# Patient Record
Sex: Female | Born: 1995 | Race: Black or African American | Hispanic: No | Marital: Single | Smoking: Current some day smoker
Health system: Southern US, Community
[De-identification: ages and names within clinical notes are randomized; demographics above are authoritative.]

## PROBLEM LIST (undated history)

## (undated) ENCOUNTER — Inpatient Hospital Stay: Payer: Self-pay

## (undated) DIAGNOSIS — D649 Anemia, unspecified: Secondary | ICD-10-CM

## (undated) HISTORY — PX: TONSILLECTOMY: SUR1361

## (undated) HISTORY — PX: NASAL SINUS SURGERY: SHX719

---
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
## 2013-06-20 ENCOUNTER — Inpatient Hospital Stay: Payer: Self-pay | Admitting: Internal Medicine

## 2013-06-20 LAB — COMPREHENSIVE METABOLIC PANEL
Albumin: 3.5 g/dL — ABNORMAL LOW (ref 3.8–5.6)
Alkaline Phosphatase: 123 U/L (ref 82–169)
Anion Gap: 8 (ref 7–16)
BUN: 14 mg/dL (ref 7–18)
Bilirubin,Total: 1.4 mg/dL — ABNORMAL HIGH (ref 0.2–1.0)
Chloride: 102 mmol/L (ref 98–107)
Co2: 24 mmol/L (ref 21–32)
Creatinine: 1.04 mg/dL (ref 0.60–1.30)
EGFR (Non-African Amer.): >60
Glucose: 123 mg/dL — ABNORMAL HIGH (ref 65–99)
Osmolality: 270 (ref 275–301)
Potassium: 3.7 mmol/L (ref 3.5–5.1)
SGOT(AST): 38 U/L — ABNORMAL HIGH (ref 0–26)
SGPT (ALT): 37 U/L (ref 12–78)
Sodium: 134 mmol/L — ABNORMAL LOW (ref 136–145)
Total Protein: 8.4 g/dL (ref 6.4–8.6)

## 2013-06-20 LAB — CBC
HCT: 34.7 % — ABNORMAL LOW (ref 35.0–47.0)
HGB: 11.2 g/dL — ABNORMAL LOW (ref 12.0–16.0)
MCH: 25.8 pg — ABNORMAL LOW (ref 26.0–34.0)
MCHC: 32.4 g/dL (ref 32.0–36.0)
MCV: 80 fL (ref 80–100)
Platelet: 225 10*3/uL (ref 150–440)
RBC: 4.36 10*6/uL (ref 3.80–5.20)
RDW: 14.9 % — ABNORMAL HIGH (ref 11.5–14.5)
WBC: 17.8 10*3/uL — ABNORMAL HIGH (ref 3.6–11.0)

## 2013-06-20 LAB — URINALYSIS, COMPLETE
Bilirubin,UR: NEGATIVE
Nitrite: POSITIVE
Protein: 100
RBC,UR: 14 /HPF (ref 0–5)
Renal Epithelial: 1
Specific Gravity: 1.021 (ref 1.003–1.030)

## 2013-06-20 LAB — LIPASE, BLOOD: Lipase: 39 U/L — ABNORMAL LOW (ref 73–393)

## 2013-06-21 LAB — CBC WITH DIFFERENTIAL/PLATELET
Basophil #: 0 10*3/uL (ref 0.0–0.1)
Basophil %: 0.3 %
Eosinophil #: 0.1 10*3/uL (ref 0.0–0.7)
Eosinophil %: 0.4 %
Lymphocyte #: 2.8 10*3/uL (ref 1.0–3.6)
Lymphocyte %: 18 %
MCH: 26.1 pg (ref 26.0–34.0)
MCV: 79 fL — ABNORMAL LOW (ref 80–100)
Monocyte #: 1.4 x10 3/mm — ABNORMAL HIGH (ref 0.2–0.9)
Monocyte %: 8.8 %
Neutrophil #: 11.4 10*3/uL — ABNORMAL HIGH (ref 1.4–6.5)
Neutrophil %: 72.5 %
Platelet: 183 10*3/uL (ref 150–440)
RBC: 3.65 10*6/uL — ABNORMAL LOW (ref 3.80–5.20)
RDW: 15 % — ABNORMAL HIGH (ref 11.5–14.5)
WBC: 15.8 10*3/uL — ABNORMAL HIGH (ref 3.6–11.0)

## 2013-06-21 LAB — BASIC METABOLIC PANEL
Anion Gap: 7 (ref 7–16)
Calcium, Total: 8 mg/dL — ABNORMAL LOW (ref 9.0–10.7)
Chloride: 108 mmol/L — ABNORMAL HIGH (ref 98–107)
EGFR (African American): >60
Sodium: 139 mmol/L (ref 136–145)

## 2013-06-22 LAB — URINE CULTURE

## 2013-06-25 LAB — CULTURE, BLOOD (SINGLE)

## 2013-11-12 ENCOUNTER — Emergency Department: Payer: Self-pay | Admitting: Internal Medicine

## 2013-11-12 LAB — COMPREHENSIVE METABOLIC PANEL
ALBUMIN: 3.8 g/dL (ref 3.4–5.0)
Alkaline Phosphatase: 70 U/L
Anion Gap: 6 — ABNORMAL LOW (ref 7–16)
BUN: 12 mg/dL (ref 7–18)
Bilirubin,Total: 0.3 mg/dL (ref 0.2–1.0)
CALCIUM: 8.7 mg/dL (ref 8.5–10.1)
CHLORIDE: 104 mmol/L (ref 98–107)
CO2: 25 mmol/L (ref 21–32)
Creatinine: 0.55 mg/dL — ABNORMAL LOW (ref 0.60–1.30)
Glucose: 86 mg/dL (ref 65–99)
Osmolality: 269 (ref 275–301)
Potassium: 3.3 mmol/L — ABNORMAL LOW (ref 3.5–5.1)
SGOT(AST): 21 U/L (ref 15–37)
SGPT (ALT): 18 U/L (ref 12–78)
Sodium: 135 mmol/L — ABNORMAL LOW (ref 136–145)
TOTAL PROTEIN: 8.1 g/dL (ref 6.4–8.2)

## 2013-11-12 LAB — HCG, QUANTITATIVE, PREGNANCY: BETA HCG, QUANT.: 48223 m[IU]/mL — AB

## 2013-11-12 LAB — CBC
HCT: 31.2 % — ABNORMAL LOW (ref 35.0–47.0)
HGB: 10 g/dL — AB (ref 12.0–16.0)
MCH: 25 pg — ABNORMAL LOW (ref 26.0–34.0)
MCHC: 32.2 g/dL (ref 32.0–36.0)
MCV: 78 fL — AB (ref 80–100)
PLATELETS: 298 10*3/uL (ref 150–440)
RBC: 4.01 10*6/uL (ref 3.80–5.20)
RDW: 14.5 % (ref 11.5–14.5)
WBC: 6.5 10*3/uL (ref 3.6–11.0)

## 2013-11-12 LAB — URINALYSIS, COMPLETE
BILIRUBIN, UR: NEGATIVE
Blood: NEGATIVE
Glucose,UR: NEGATIVE mg/dL (ref 0–75)
Ketone: NEGATIVE
Nitrite: NEGATIVE
PROTEIN: NEGATIVE
Ph: 6 (ref 4.5–8.0)
RBC,UR: 2 /HPF (ref 0–5)
Specific Gravity: 1.024 (ref 1.003–1.030)
WBC UR: 6 /HPF (ref 0–5)

## 2013-11-12 LAB — GC/CHLAMYDIA PROBE AMP

## 2013-11-12 LAB — WET PREP, GENITAL

## 2013-11-14 LAB — URINE CULTURE

## 2014-04-19 ENCOUNTER — Ambulatory Visit: Payer: Self-pay | Admitting: Oncology

## 2014-04-19 LAB — CBC CANCER CENTER
BASOS PCT: 0.1 %
Basophil #: 0 x10 3/mm (ref 0.0–0.1)
EOS ABS: 0.1 x10 3/mm (ref 0.0–0.7)
EOS PCT: 0.8 %
HCT: 27.3 % — AB (ref 35.0–47.0)
HGB: 8.6 g/dL — ABNORMAL LOW (ref 12.0–16.0)
LYMPHS ABS: 2 x10 3/mm (ref 1.0–3.6)
Lymphocyte %: 25.6 %
MCH: 25.2 pg — ABNORMAL LOW (ref 26.0–34.0)
MCHC: 31.5 g/dL — ABNORMAL LOW (ref 32.0–36.0)
MCV: 80 fL (ref 80–100)
MONO ABS: 0.6 x10 3/mm (ref 0.2–0.9)
Monocyte %: 7.7 %
NEUTROS ABS: 5 x10 3/mm (ref 1.4–6.5)
Neutrophil %: 65.8 %
Platelet: 324 x10 3/mm (ref 150–440)
RBC: 3.41 10*6/uL — ABNORMAL LOW (ref 3.80–5.20)
RDW: 15.1 % — ABNORMAL HIGH (ref 11.5–14.5)
WBC: 7.6 x10 3/mm (ref 3.6–11.0)

## 2014-04-19 LAB — IRON AND TIBC
IRON BIND. CAP.(TOTAL): 540 ug/dL — AB (ref 250–450)
IRON SATURATION: 33 %
Iron: 178 ug/dL — ABNORMAL HIGH (ref 50–170)
UNBOUND IRON-BIND. CAP.: 362 ug/dL

## 2014-04-19 LAB — RETICULOCYTES
Absolute Retic Count: 0.0899 10*6/uL (ref 0.019–0.186)
Reticulocyte: 2.64 % (ref 0.4–3.1)

## 2014-04-19 LAB — FOLATE: FOLIC ACID: 17.5 ng/mL (ref 3.1–100.0)

## 2014-04-19 LAB — LACTATE DEHYDROGENASE: LDH: 204 U/L (ref 81–246)

## 2014-04-19 LAB — FERRITIN: FERRITIN (ARMC): 7 ng/mL — AB (ref 8–388)

## 2014-04-20 ENCOUNTER — Ambulatory Visit: Payer: Self-pay | Admitting: Oncology

## 2014-05-20 ENCOUNTER — Ambulatory Visit: Payer: Self-pay | Admitting: Oncology

## 2014-06-21 ENCOUNTER — Inpatient Hospital Stay: Payer: Self-pay

## 2014-06-21 LAB — PIH PROFILE
ANION GAP: 9 (ref 7–16)
BUN: 6 mg/dL — AB (ref 7–18)
CHLORIDE: 107 mmol/L (ref 98–107)
CREATININE: 0.52 mg/dL — AB (ref 0.60–1.30)
Calcium, Total: 8.7 mg/dL (ref 8.5–10.1)
Co2: 26 mmol/L (ref 21–32)
EGFR (Non-African Amer.): >60
GLUCOSE: 82 mg/dL (ref 65–99)
HCT: 32.3 % — ABNORMAL LOW (ref 35.0–47.0)
HGB: 10.4 g/dL — ABNORMAL LOW (ref 12.0–16.0)
MCH: 26.9 pg (ref 26.0–34.0)
MCHC: 32.2 g/dL (ref 32.0–36.0)
MCV: 84 fL (ref 80–100)
Osmolality: 280 (ref 275–301)
Platelet: 259 10*3/uL (ref 150–440)
Potassium: 3.4 mmol/L — ABNORMAL LOW (ref 3.5–5.1)
RBC: 3.87 10*6/uL (ref 3.80–5.20)
RDW: 20.7 % — AB (ref 11.5–14.5)
SGOT(AST): 24 U/L (ref 15–37)
SODIUM: 142 mmol/L (ref 136–145)
URIC ACID: 2.9 mg/dL (ref 2.6–6.0)
WBC: 9.4 10*3/uL (ref 3.6–11.0)

## 2014-06-21 LAB — PROTEIN / CREATININE RATIO, URINE
Creatinine, Urine: 59.5 mg/dL (ref 30.0–125.0)
PROTEIN/CREAT. RATIO: 1227 mg/g{creat} — AB (ref 0–200)
Protein, Random Urine: 73 mg/dL — ABNORMAL HIGH (ref 0–12)

## 2014-06-23 LAB — CBC WITH DIFFERENTIAL/PLATELET
BASOS ABS: 0 10*3/uL (ref 0.0–0.1)
BASOS PCT: 0.1 %
EOS ABS: 0.1 10*3/uL (ref 0.0–0.7)
Eosinophil %: 0.5 %
HCT: 34.3 % — ABNORMAL LOW (ref 35.0–47.0)
HGB: 11 g/dL — AB (ref 12.0–16.0)
Lymphocyte #: 1.9 10*3/uL (ref 1.0–3.6)
Lymphocyte %: 19 %
MCH: 27 pg (ref 26.0–34.0)
MCHC: 32.1 g/dL (ref 32.0–36.0)
MCV: 84 fL (ref 80–100)
MONOS PCT: 10.8 %
Monocyte #: 1.1 x10 3/mm — ABNORMAL HIGH (ref 0.2–0.9)
NEUTROS ABS: 7.1 10*3/uL — AB (ref 1.4–6.5)
Neutrophil %: 69.6 %
Platelet: 288 10*3/uL (ref 150–440)
RBC: 4.08 10*6/uL (ref 3.80–5.20)
RDW: 20.5 % — ABNORMAL HIGH (ref 11.5–14.5)
WBC: 10.2 10*3/uL (ref 3.6–11.0)

## 2014-06-23 LAB — COMPREHENSIVE METABOLIC PANEL
ALBUMIN: 2.6 g/dL — AB (ref 3.4–5.0)
ALK PHOS: 275 U/L — AB
ANION GAP: 10 (ref 7–16)
BUN: 4 mg/dL — ABNORMAL LOW (ref 7–18)
Bilirubin,Total: 0.3 mg/dL (ref 0.2–1.0)
CO2: 23 mmol/L (ref 21–32)
Calcium, Total: 8.6 mg/dL (ref 8.5–10.1)
Chloride: 108 mmol/L — ABNORMAL HIGH (ref 98–107)
Creatinine: 0.53 mg/dL — ABNORMAL LOW (ref 0.60–1.30)
EGFR (African American): >60
EGFR (Non-African Amer.): >60
Glucose: 91 mg/dL (ref 65–99)
Osmolality: 278 (ref 275–301)
Potassium: 3.3 mmol/L — ABNORMAL LOW (ref 3.5–5.1)
SGOT(AST): 27 U/L (ref 15–37)
SGPT (ALT): 34 U/L
SODIUM: 141 mmol/L (ref 136–145)
Total Protein: 7.2 g/dL (ref 6.4–8.2)

## 2014-06-24 ENCOUNTER — Ambulatory Visit: Payer: Self-pay | Admitting: Oncology

## 2014-06-24 LAB — GC/CHLAMYDIA PROBE AMP

## 2014-06-24 LAB — CBC WITH DIFFERENTIAL/PLATELET
BASOS PCT: 0.2 %
Basophil #: 0 10*3/uL (ref 0.0–0.1)
EOS PCT: 0.6 %
Eosinophil #: 0.1 10*3/uL (ref 0.0–0.7)
HCT: 28.3 % — ABNORMAL LOW (ref 35.0–47.0)
HGB: 8.8 g/dL — ABNORMAL LOW (ref 12.0–16.0)
LYMPHS PCT: 19 %
Lymphocyte #: 2.5 10*3/uL (ref 1.0–3.6)
MCH: 26.2 pg (ref 26.0–34.0)
MCHC: 31.2 g/dL — AB (ref 32.0–36.0)
MCV: 84 fL (ref 80–100)
MONO ABS: 1.1 x10 3/mm — AB (ref 0.2–0.9)
Monocyte %: 8.4 %
Neutrophil #: 9.3 10*3/uL — ABNORMAL HIGH (ref 1.4–6.5)
Neutrophil %: 71.8 %
Platelet: 230 10*3/uL (ref 150–440)
RBC: 3.36 10*6/uL — ABNORMAL LOW (ref 3.80–5.20)
RDW: 20.9 % — AB (ref 11.5–14.5)
WBC: 12.9 10*3/uL — AB (ref 3.6–11.0)

## 2014-06-27 ENCOUNTER — Ambulatory Visit: Payer: Self-pay | Admitting: Oncology

## 2014-08-17 ENCOUNTER — Emergency Department: Payer: Self-pay | Admitting: Emergency Medicine

## 2014-08-17 LAB — CBC WITH DIFFERENTIAL/PLATELET
BASOS ABS: 0 10*3/uL (ref 0.0–0.1)
Basophil %: 0.2 %
EOS ABS: 0 10*3/uL (ref 0.0–0.7)
EOS PCT: 0 %
HCT: 35.6 % (ref 35.0–47.0)
HGB: 11.3 g/dL — ABNORMAL LOW (ref 12.0–16.0)
LYMPHS PCT: 11.9 %
Lymphocyte #: 2.2 10*3/uL (ref 1.0–3.6)
MCH: 26.3 pg (ref 26.0–34.0)
MCHC: 31.7 g/dL — ABNORMAL LOW (ref 32.0–36.0)
MCV: 83 fL (ref 80–100)
Monocyte #: 1.6 x10 3/mm — ABNORMAL HIGH (ref 0.2–0.9)
Monocyte %: 8.7 %
NEUTROS PCT: 79.2 %
Neutrophil #: 14.7 10*3/uL — ABNORMAL HIGH (ref 1.4–6.5)
PLATELETS: 271 10*3/uL (ref 150–440)
RBC: 4.28 10*6/uL (ref 3.80–5.20)
RDW: 15.2 % — AB (ref 11.5–14.5)
WBC: 18.6 10*3/uL — ABNORMAL HIGH (ref 3.6–11.0)

## 2014-08-17 LAB — URINALYSIS, COMPLETE
Bilirubin,UR: NEGATIVE
Glucose,UR: NEGATIVE mg/dL (ref 0–75)
KETONE: NEGATIVE
NITRITE: POSITIVE
Ph: 6 (ref 4.5–8.0)
Protein: 100
Specific Gravity: 1.012 (ref 1.003–1.030)
Squamous Epithelial: 18
WBC UR: 403 /HPF (ref 0–5)

## 2014-08-17 LAB — COMPREHENSIVE METABOLIC PANEL
ANION GAP: 9 (ref 7–16)
AST: 22 U/L (ref 15–37)
Albumin: 3.2 g/dL — ABNORMAL LOW (ref 3.4–5.0)
Alkaline Phosphatase: 121 U/L — ABNORMAL HIGH
BUN: 9 mg/dL (ref 7–18)
Bilirubin,Total: 1.3 mg/dL — ABNORMAL HIGH (ref 0.2–1.0)
CALCIUM: 8.6 mg/dL (ref 8.5–10.1)
CO2: 23 mmol/L (ref 21–32)
CREATININE: 1.06 mg/dL (ref 0.60–1.30)
Chloride: 106 mmol/L (ref 98–107)
EGFR (African American): >60
EGFR (Non-African Amer.): >60
Glucose: 114 mg/dL — ABNORMAL HIGH (ref 65–99)
Osmolality: 275 (ref 275–301)
POTASSIUM: 3.1 mmol/L — AB (ref 3.5–5.1)
SGPT (ALT): 27 U/L
Sodium: 138 mmol/L (ref 136–145)
Total Protein: 7.5 g/dL (ref 6.4–8.2)

## 2014-08-19 LAB — URINE CULTURE

## 2014-08-22 LAB — CULTURE, BLOOD (SINGLE)

## 2014-12-10 NOTE — Discharge Summary (Signed)
PATIENT NAME:  Joy Berry, Joy Berry MR#:  [id number] DATE OF BIRTH:  1996/04/19  DATE OF ADMISSION:  06/20/2013 DATE OF DISCHARGE:  06/22/2013  PRESENTING COMPLAINT: Fever, abdominal pain and vomiting.   DISCHARGE DIAGNOSES: 1. Acute bilateral pyelonephritis, left more than the right.  2. Escherichia coli urinary tract infection.  3/ Nausea and vomiting, resolved.   CODE STATUS: FULL CODE.   MEDICATIONS: 1. Tylenol orally as needed.  2. Cefuroxime 500 mg Berry.o. b.i.d. for six more days to complete a 10 day course.   FOLLOWUP: With Dr. Lacie ScottsNiemeyer in 1 to 2 weeks.   LABORATORY DATA:  1. White count is 15.8, H and H is 9.6 and 28.8. Glucose is 121, BUN 9, creatinine 0.85, sodium is 139, potassium is 3.3, chloride is 108, bicarbonate is 24.  2. Blood cultures negative in 48 hours.  3. Urine culture positive for Escherichia coli which is pan sensitive, just resistant to ampicillin.  4. CT of the abdomen and pelvis with contrast shows findings concerning for pyelonephritis, worse on the left than the right.   BRIEF SUMMARY OF HOSPITAL COURSE: Joy Berry is a 19 year old African American female with no past medical history, comes into the Emergency Room with nausea, vomiting along with left flank pain. She is being admitted with:  1. Systemic inflammatory response syndrome secondary to bilateral pyelonephritis. Bilateral hydronephrosis worse on the left than the right. She was started on IV fluids, broad-spectrum antibiotics with IV Rocephin b.i.d. Blood cultures are negative. Urine cultures positive for Escherichia coli. Her medications have been changed to Berry.o. cefuroxime. She will complete a 10 day course. Follow up with Dr. Lacie ScottsNiemeyer as outpatient if she remains afebrile. The patient is recommended to take Tylenol for low-grade fever if she has.  2. Leukocytosis, appears to be due to bilateral pyelonephritis improving.  3. Mild hyponatremia resolved with IV fluids.  4. Deep vein thrombosis  prophylaxis. The patient was ambulatory and subcutaneous Lovenox was given.   Hospital stay otherwise remained stable. Discharge plan was discussed with the patient's mother over on the phone.   TIME SPENT: 40 minutes. ____________________________ Wylie HailSona A. Allena KatzPatel, MD sap:sg D: 06/22/2013 13:55:45 ET T: 06/22/2013 14:42:25 ET JOB#: [id number]  cc: Joy Berry A. Allena KatzPatel, MD, <Dictator> Meindert A. Lacie ScottsNiemeyer, MD  Willow OraSONA A Haydan Mansouri MD ELECTRONICALLY SIGNED 06/25/2013 13:31

## 2014-12-10 NOTE — H&P (Signed)
PATIENT NAME:  Joy Berry, Joy Berry MR#:  [id number] DATE OF BIRTH:  October 24, 1995  DATE OF ADMISSION:  06/20/2013  PRESENTING COMPLAINTS: Vomiting and left back pain.    HISTORY OF PRESENT ILLNESS:  Joy Berry is a 19 year old African American female with no significant past medical history, who comes to the Emergency Room with complaints of nausea, vomiting and left flank and left upper quadrant pain for the last 2 days. In the Emergency Room, the patient was found to have fever of 100.9. She is tachycardic with heart rate in the 110s to 120s. She was found to have elevated white count of 17,000 and urinalysis was severely positive for urinary tract infection.  Given her symptoms, CT of the abdomen was done which shows bilateral pyelonephritis and the patient is being admitted with SIRS secondary to bilateral pyelonephritis worse on the left than the right. She received one dose of IV Rocephin in the Emergency Room. Blood cultures and urine cultures were sent.   PAST MEDICAL HISTORY: None.   ALLERGIES: No known drug allergies.   PAST SURGICAL HISTORY: Tonsillectomy and some sinus surgery as a child.   MEDICATIONS:  Tylenol as needed.   SOCIAL HISTORY: The patient is currently unemployed. Smokes a "cigar" on a daily basis. Denies any other street drug use or any alcohol use.   FAMILY HISTORY: Positive for hypertension in mother.   REVIEW OF SYSTEMS:  CONSTITUTIONAL: No fever. Positive for fatigue, weakness.  EYES: No blurred or double vision.  ENT: No tinnitus, ear pain, hearing loss.  RESPIRATORY: No cough, hemoptysis or shortness of breath.  CARDIOVASCULAR: No chest pain, orthopnea, edema or hypertension.  GASTROINTESTINAL: Positive for vomiting and abdominal pain. No melena or diarrhea.  GENITOURINARY: Positive for dysuria, frequency and foul-smelling urine.  ENDOCRINE: No polyuria, nocturia or thyroid problems.  HEMATOLOGY: No anemia or easy bruising or any bleeding disorder.   MUSCULOSKELETAL: No arthritis, swelling or gout.  NEUROLOGIC: No CVA, TIA, tremors or vertigo.  PSYCHIATRIC:  No anxiety, depression or bipolar disorder.  All other systems reviewed are negative.   PHYSICAL EXAMINATION: GENERAL: The patient is awake, alert, oriented x 3, not in acute distress.  VITAL SIGNS: Temperature is 100.7, pulse is 128, blood pressure 118/74, pulse oximetry is 99% on room air.  HEENT: Atraumatic, normocephalic. Pupils: PERRLA.  EOM intact. Oral mucosa is dry.  NECK: Supple. No JVD. No carotid bruit.  RESPIRATORY: Clear to auscultation bilaterally. No rales, rhonchi, respiratory distress or labored breathing.  CARDIOVASCULAR: Tachycardia present. Both heart sounds are normal. No murmur heard. PMI not lateralized. Chest is nontender.  ABDOMEN: Soft. There is some tenderness present in the left upper quadrant along with left flank and left costovertebral angle. No guarding, rigidity or any mass felt. There is some tenderness in the right costovertebral angle as well. Bowel sounds are normal.  SKIN: Warm and dry.  NEUROLOGIC:  Grossly intact cranial nerves II through XII.  No motor or sensory deficit. No tremors.  PSYCHIATRIC: The patient is awake, alert, oriented x 3.   LABORATORY AND IMAGING DATA:  CT of the abdomen shows findings concerning for pyelonephritis worse on the left than the right.   Lactic acid 1.3. White count is 17.8, hemoglobin and hematocrit is 11.2 and 34.7, platelet count is 225. Glucose is 123, BUN 14 creatinine is 1.04, sodium 134. The rest of the chemistries are normal. Bilirubin is 1.4 and SGOT is 38, lipase is 39. Urinalysis positive for urinary tract infection.   ASSESSMENT: A 19 year old,  Joy Berry with no significant past medical history, comes in with fever, nausea and vomiting along with left flank pain is being admitted with:  1.  Systemic inflammatory response syndrome secondary to bilateral palmar nephritis, worse on the left than the  right. The patient is going to be admitted on the medical floor. Continue on IV fluids. Continue broad-spectrum antibiotics with IV Rocephin b.i.d. for now. Follow blood cultures, urine cultures, and white count. We will give Tylenol around the clock and continue aggressive intravenous fluids for hydration.  2.  Leukocytosis, appears to be due to bilateral pyelonephritis.  3.  Mild hyponatremia is suspected due to mild dehydration. Will give aggressive IV hydration.  4.  Deep vein thrombosis prophylaxis with subcutaneous Lovenox.  5.  Diet. We will start patient on clear liquid diet and advance to regular food as tolerated.  6.  Above was discussed with the patient and the patient's family member.  Further work-up according to the patient's clinical course.   TIME SPENT: 55 minutes    ____________________________ Jearl KlinefelterSona A. Allena KatzPatel, MD sap:dp D: 06/20/2013 09:16:38 ET T: 06/20/2013 09:41:40 ET JOB#: [id number]  cc: Chenae Brager A. Allena KatzPatel, MD, <Dictator> Willow OraSONA A Brianne Maina MD ELECTRONICALLY SIGNED 06/25/2013 13:29

## 2014-12-28 NOTE — H&P (Signed)
L&D Evaluation:  History:  HPI 18yo G1P0 at 38+5 with EDC of 07/01/14 presenting for evaluation after elevated BP in the office, with 2+protein on urine dip. No prior hx of BP >140, though have been in 130s without protein on dip. Denies sx of severe PreE - no visual changes, no headache, no RUQ pain. +LE swelling for last month. Good FM, no LOF, occassional contractions.   Initial BP in triage 157/94. Serial pressures on admission with highest pressure at 21:53 of 165/105 with immediate repeat of 142/83.  PIH labs wnl except for protein/creatinine ratio of 1226. No clonus, 1+ reflexes.  FHT- Reactive NST   Presents with elevated BP   Patient's Medical History No Chronic Illness   Patient's Surgical History none   Medications Pre Natal Vitamins  Iron   Allergies NKDA   Social History none   Family History Non-Contributory   ROS:  ROS All systems were reviewed.  HEENT, CNS, GI, GU, Respiratory, CV, Renal and Musculoskeletal systems were found to be normal.   Exam:  Vital Signs BP >140/90   Urine Protein 1+   General no apparent distress   Mental Status clear   Chest clear  CTAB   Heart normal sinus rhythm, no murmur/gallop/rubs   Abdomen gravid, non-tender   Estimated Fetal Weight Average for gestational age   Fetal Position vtx by Leopolds   Back no CVAT   Edema 2+  Pitting   Reflexes 2+   Clonus negative   Pelvic no external lesions, 1/50/high, posterior, moderately soft   Mebranes Intact   FHT normal rate with no decels   Ucx irregular   Impression:  Impression PreEclampsia   Plan:  Plan antibiotics for GBBS prophylaxis   Comments G1P0 at 38+5wks with preE without severe features based on elevated BP and new onset proteinuria. Fetal testing reassuring. - IOL per guidelines at term. Will not start mag at this time as no severe features per recent ACOG guidelines, though administration is reasonable in active labor. Will continuously  reassess. Bed availability limited at this time, will plan for cervical ripening starting in am. IOL expectations discussed with patient and family. PreE progression and treatment discussed with family. - Serial BP - GBS ppx not indicated - Rh positive - Pt interested in epidural when in labor   Electronic Signatures: Cline CoolsBeasley, Kahlil Cowans E (MD)  (Signed [id number]-Nov-15 04:23)  Authored: L&D Evaluation   Last Updated: 03-Nov-15 04:23 by Cline CoolsBeasley, Yen Wandell E (MD)

## 2015-01-27 ENCOUNTER — Encounter: Payer: Self-pay | Admitting: Emergency Medicine

## 2015-01-27 ENCOUNTER — Emergency Department
Admission: EM | Admit: 2015-01-27 | Discharge: 2015-01-27 | Disposition: A | Discharge status: Home / Self Care | Payer: Medicaid Other | Attending: Emergency Medicine | Admitting: Emergency Medicine

## 2015-01-27 DIAGNOSIS — R6883 Chills (without fever): Secondary | ICD-10-CM | POA: Diagnosis present

## 2015-01-27 DIAGNOSIS — J3489 Other specified disorders of nose and nasal sinuses: Secondary | ICD-10-CM | POA: Insufficient documentation

## 2015-01-27 DIAGNOSIS — R040 Epistaxis: Secondary | ICD-10-CM | POA: Insufficient documentation

## 2015-01-27 DIAGNOSIS — J0120 Acute ethmoidal sinusitis, unspecified: Secondary | ICD-10-CM | POA: Diagnosis not present

## 2015-01-27 HISTORY — DX: Anemia, unspecified: D64.9

## 2015-01-27 MED ORDER — AMOXICILLIN-POT CLAVULANATE 875-125 MG PO TABS: 1.0000 | ORAL_TABLET | Freq: Two times a day (BID) | ORAL | Status: DC

## 2015-01-27 NOTE — ED Notes (Signed)
NAD noted at time of D/C. Pt denies questions or concerns. Pt ambulatory to the lobby at this time.  

## 2015-01-27 NOTE — ED Provider Notes (Signed)
[facility] Emergency Department Provider Note  ____________________________________________  Time seen: Approximately 1:05 PM  I have reviewed the triage vital signs and the nursing notes.   HISTORY  Chief Complaint Epistaxis and Chills    HPI Joy Berry is a 19 y.o. female who presents to the emergency department for a 3 day history of sinus pain. She reports that last night she had 2 episodes of nose bleeding from both sides.She denies nosebleeds today. She denies fever or nausea. She is also complaining of a generalized headache.   Past Medical History  Diagnosis Date  . Anemia     There are no active problems to display for this patient.   No past surgical history on file.  Current Outpatient Rx  Name  Route  Sig  Dispense  Refill  . amoxicillin-clavulanate (AUGMENTIN) 875-125 MG per tablet   Oral   Take 1 tablet by mouth 2 (two) times daily.   20 tablet   0     Allergies Review of patient's allergies indicates no known allergies.  No family history on file.  Social History History  Substance Use Topics  . Smoking status: Never Smoker   . Smokeless tobacco: Not on file  . Alcohol Use: No    Review of Systems Constitutional: No fever. Positive for chills. Eyes: No visual changes. ENT: No sore throat. Cardiovascular: Denies chest pain. Respiratory: Denies shortness of breath. Gastrointestinal: No abdominal pain.  No nausea, no vomiting.  No diarrhea.  No constipation. Genitourinary: Negative for dysuria. Musculoskeletal: Negative for back pain. Skin: Negative for rash. Neurological: Negative for headaches, focal weakness or numbness.  10-point ROS otherwise negative.  ____________________________________________   PHYSICAL EXAM:  VITAL SIGNS: ED Triage Vitals  Enc Vitals Group     BP 01/27/15 1209 131/82 mmHg     Pulse Rate 01/27/15 1209 98     Resp 01/27/15 1209 14     Temp 01/27/15 1209 98.7 F (37.1 C)      Temp Source 01/27/15 1209 Oral     SpO2 01/27/15 1209 98 %     Weight 01/27/15 1209 155 lb (70.308 kg)     Height 01/27/15 1209 5\' 9"  (1.753 m)     Head Cir --      Peak Flow --      Pain Score 01/27/15 1210 8     Pain Loc --      Pain Edu? --      Excl. in GC? --     Constitutional: Alert and oriented. Well appearing and in no acute distress. Eyes: Conjunctivae are normal. PERRL. EOMI. Head: Atraumatic. Tenderness present over maxillary and ethmoid sinus areas. Nose: Inflammation and swelling of nasal mucosa without bleeding. Mouth/Throat: Mucous membranes are moist.  Oropharynx non-erythematous. Neck: No stridor.   Cardiovascular: Normal rate, regular rhythm. Grossly normal heart sounds.  Good peripheral circulation. Respiratory: Normal respiratory effort.  No retractions. Lungs CTAB. Gastrointestinal: Soft and nontender. No distention. No abdominal bruits. No CVA tenderness. Musculoskeletal: No lower extremity tenderness nor edema.  No joint effusions. Neurologic:  Normal speech and language. No gross focal neurologic deficits are appreciated. Speech is normal. No gait instability. Skin:  Skin is warm, dry and intact. No rash noted. Psychiatric: Mood and affect are normal. Speech and behavior are normal.  ____________________________________________   LABS (all labs ordered are listed, but only abnormal results are displayed)  Labs Reviewed - No data to display ____________________________________________  EKG   ____________________________________________  RADIOLOGY  ____________________________________________   PROCEDURES  Procedure(s) performed: None  Critical Care performed: No  ____________________________________________   INITIAL IMPRESSION / ASSESSMENT AND PLAN / ED COURSE  Pertinent labs & imaging results that were available during my care of the patient were reviewed by me and considered in my medical decision making (see chart for  details).  Patient was advised to return to the ER for epistaxis that does not resolve as it did last night. She was advised not to blow her nose for the next 24 hours. She was advised to follow up with the primary care provider of her choice for symptoms that are not improving over the next 48 hours. ____________________________________________   FINAL CLINICAL IMPRESSION(S) / ED DIAGNOSES  Final diagnoses:  Acute ethmoidal sinusitis, recurrence not specified      Chinita Pester, FNP 01/27/15 1620  Emily Filbert, MD 01/28/15 1258

## 2015-01-27 NOTE — ED Notes (Signed)
Says she has had cold sx. No fever.  Last night she had 2 nosebleeds and she has a headache.

## 2015-01-27 NOTE — Discharge Instructions (Signed)
Sinusitis °Sinusitis is redness, soreness, and puffiness (inflammation) of the air pockets in the bones of your face (sinuses). The redness, soreness, and puffiness can cause air and mucus to get trapped in your sinuses. This can allow germs to grow and cause an infection.  °HOME CARE  °· Drink enough fluids to keep your pee (urine) clear or pale yellow. °· Use a humidifier in your home. °· Run a hot shower to create steam in the bathroom. Sit in the bathroom with the door closed. Breathe in the steam 3-4 times a day. °· Put a warm, moist washcloth on your face 3-4 times a day, or as told by your doctor. °· Use salt water sprays (saline sprays) to wet the thick fluid in your nose. This can help the sinuses drain. °· Only take medicine as told by your doctor. °GET HELP RIGHT AWAY IF:  °· Your pain gets worse. °· You have very bad headaches. °· You are sick to your stomach (nauseous). °· You throw up (vomit). °· You are very sleepy (drowsy) all the time. °· Your face is puffy (swollen). °· Your vision changes. °· You have a stiff neck. °· You have trouble breathing. °MAKE SURE YOU:  °· Understand these instructions. °· Will watch your condition. °· Will get help right away if you are not doing well or get worse. °Document Released: 01/23/2008 Document Revised: 04/30/2012 Document Reviewed: 03/11/2012 °[employer]® Patient Information ©2015 [employer]. This information is not intended to replace advice given to you by your health care provider. Make sure you discuss any questions you have with your health care provider. ° °

## 2015-04-17 IMAGING — CT CT ABD-PELV W/ CM
2 of 4 series · 17 of 46 positions shown, 19 images · IV contrast (omnipaque)
Comparison: CT of the abdomen and pelvis 06/20/2013.

CLINICAL DATA: 18-year-old female with back pain, nausea, vomiting,
fever and weakness for the past 2 days. Postpartum as of 06/23/2014.

EXAM:
CT ABDOMEN AND PELVIS WITH CONTRAST
TECHNIQUE: Multidetector CT imaging of the abdomen and pelvis was performed
using the standard protocol following bolus administration of
intravenous contrast.
CONTRAST:  100 mL of Omnipaque 300.

[Series 2: routine abd pel with · axial · 0.61mm/px · z∈[-1084,-664]mm · 14 of 92 slices shown, 16 images]
[im 4/92  soft-tissue]
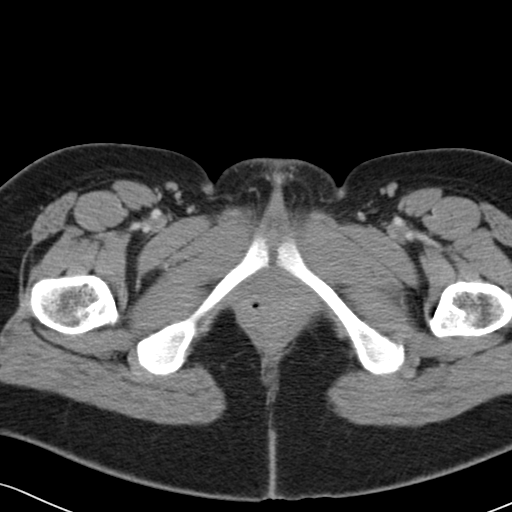
[im 4/92  bone]
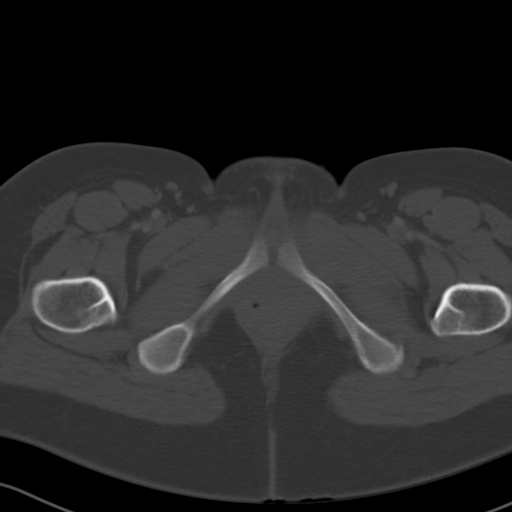
[im 12/92  soft-tissue]
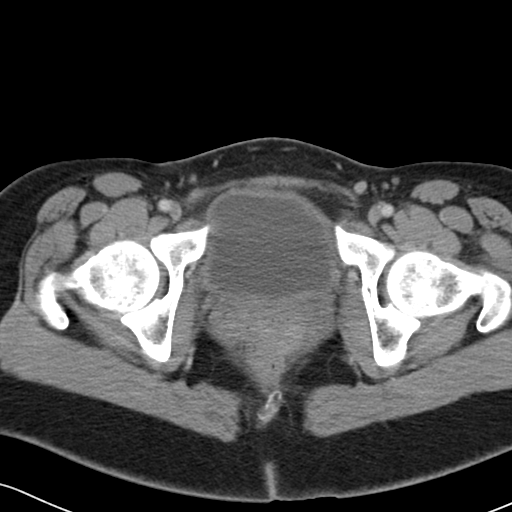
[im 19/92  soft-tissue]
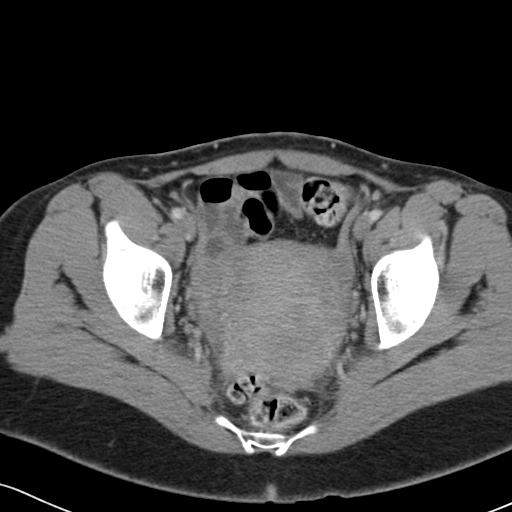
[im 23/92  soft-tissue]
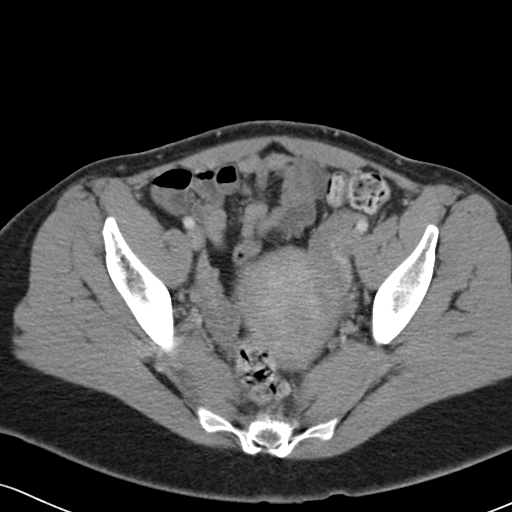
[im 31/92  soft-tissue]
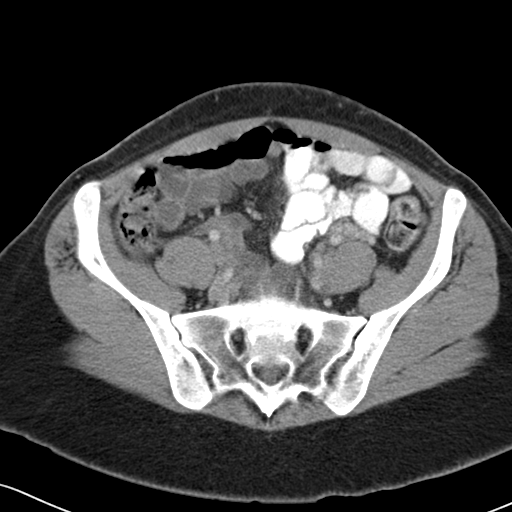
[im 38/92  soft-tissue]
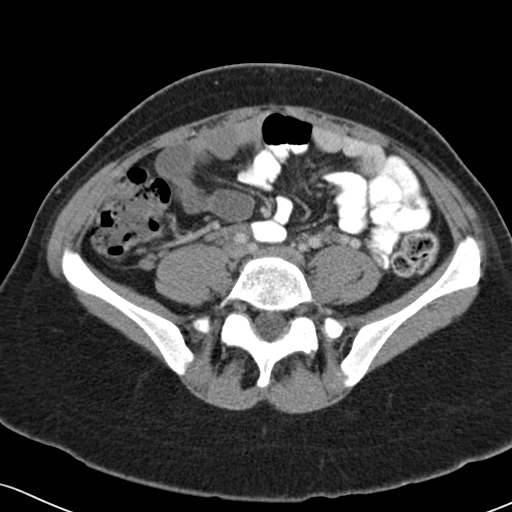
[im 42/92  soft-tissue]
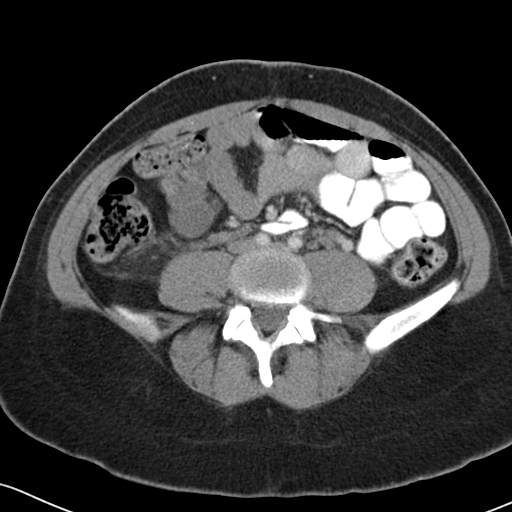
[im 50/92  soft-tissue]
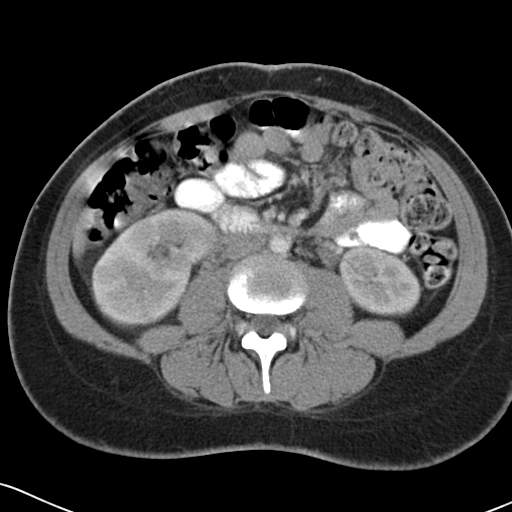
[im 54/92  soft-tissue]
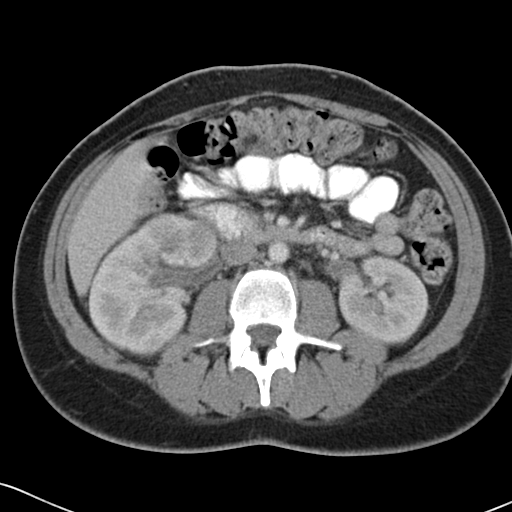
[im 54/92  bone]
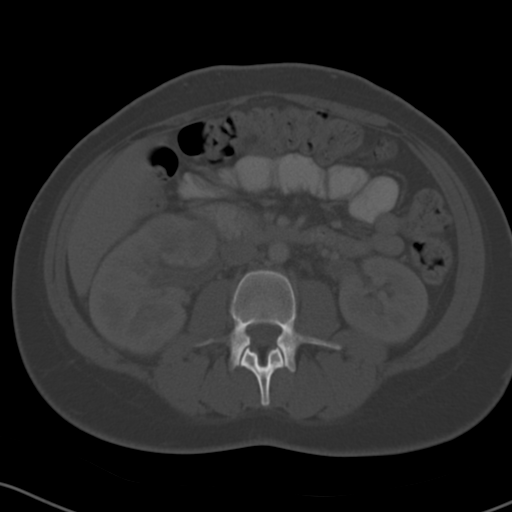
[im 61/92  soft-tissue]
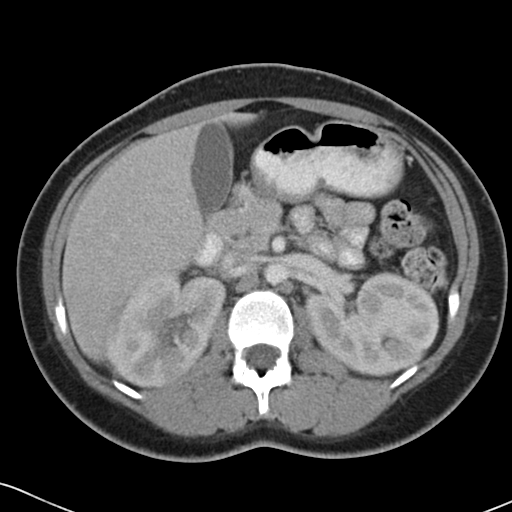
[im 69/92  soft-tissue]
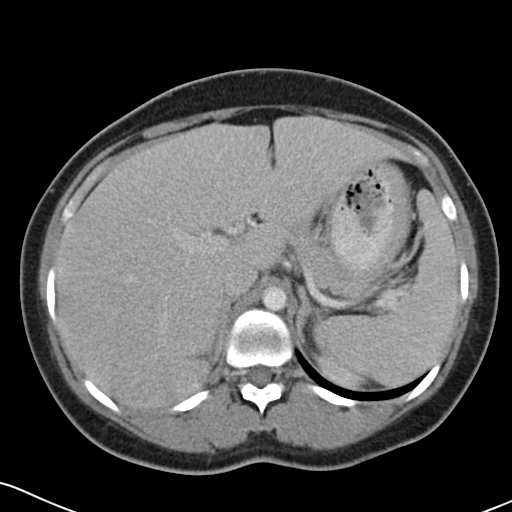
[im 73/92  soft-tissue]
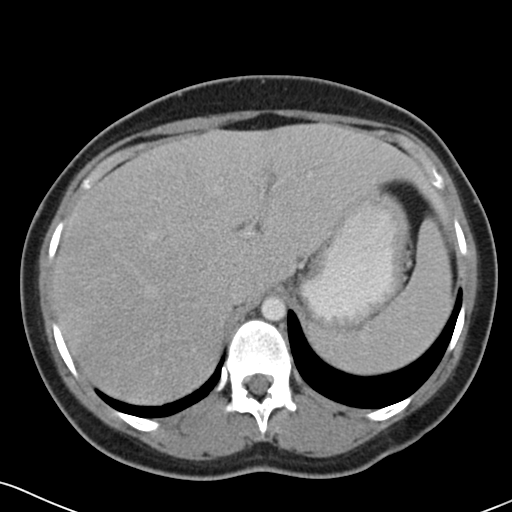
[im 80/92  soft-tissue]
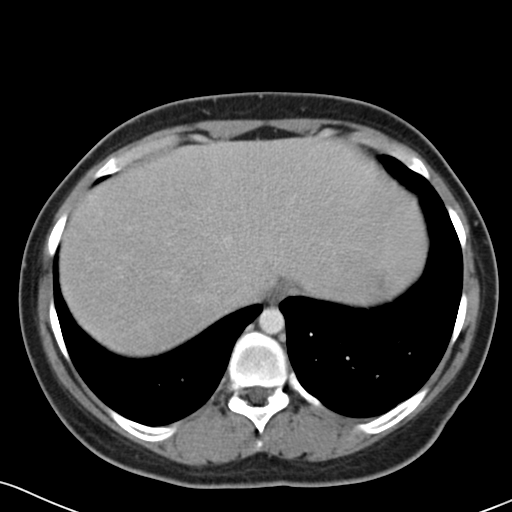
[im 88/92  soft-tissue]
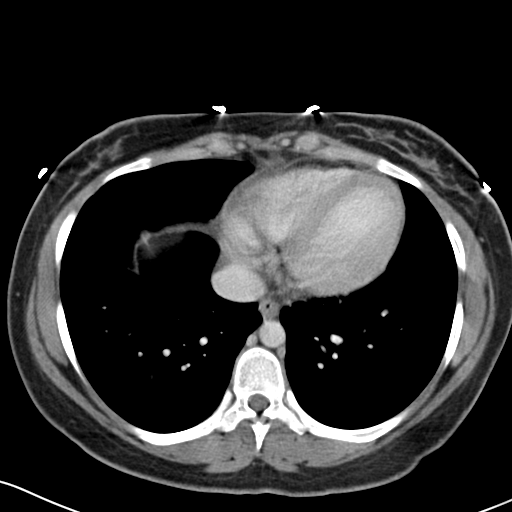

[Series 5: cor routine abd pel with · coronal · 0.93mm/px · 3 of 124 slices shown]
[im 42/124  soft-tissue]
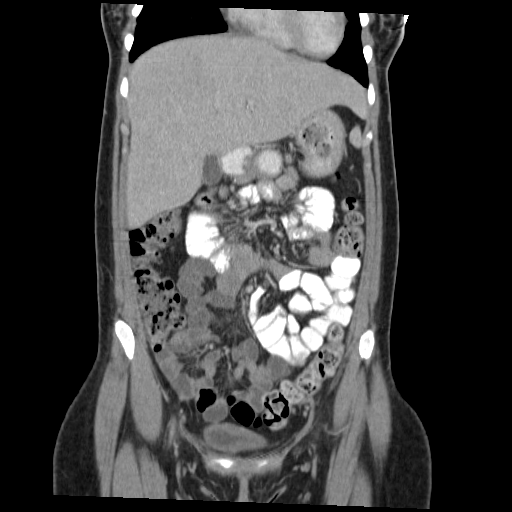
[im 55/124  soft-tissue]
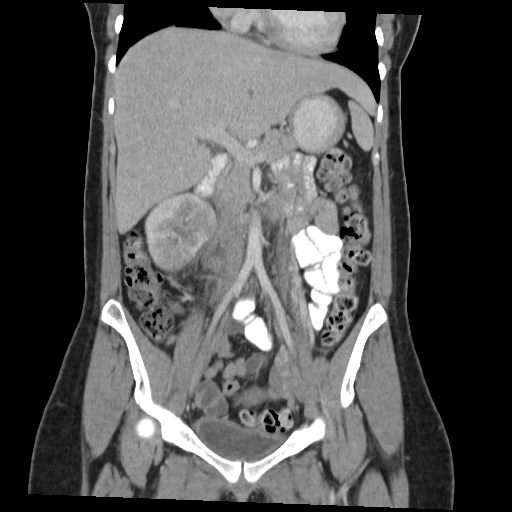
[im 69/124  soft-tissue]
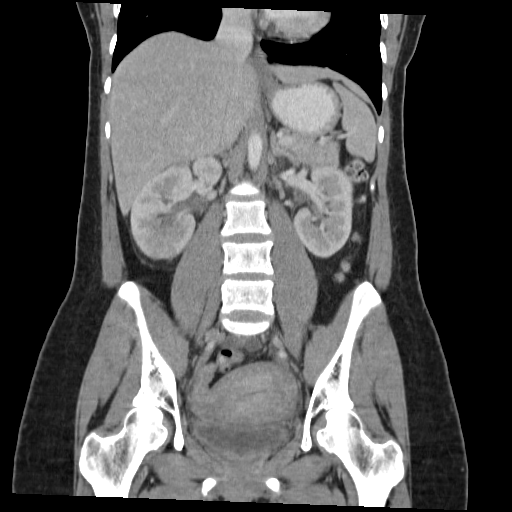

[17 of 46 positions shown; findings below may reference images not displayed]

FINDINGS: Lower chest:  Unremarkable.

Hepatobiliary: No suspicious cystic or solid hepatic lesions. No
intra or extrahepatic biliary ductal dilatation. Gallbladder is
normal in appearance.

Pancreas: Unremarkable.

Spleen: Unremarkable.

Adrenals/Urinary Tract: Heterogeneous perfusion of the kidneys
bilaterally (right greater than left), concerning for
pyelonephritis. No hydroureteronephrosis. Urinary bladder is normal
in appearance. Bilateral adrenal glands are normal in appearance.

Stomach/Bowel: Normal appearance of the stomach. No pathologic
dilatation of small bowel or colon. Normal appendix.

Vascular/Lymphatic: No significant atherosclerotic disease in the
abdominal or pelvic vasculature. No lymphadenopathy noted in the
abdomen or pelvis.

Reproductive: Uterus and ovaries are unremarkable in appearance.

Other: No significant volume of ascites.  No pneumoperitoneum.

Musculoskeletal: There are no aggressive appearing lytic or blastic
lesions noted in the visualized portions of the skeleton.
IMPRESSION: 1. Heterogeneous perfusion in the kidneys bilaterally (right greater
than left), concerning for acute pyelonephritis. Correlation with
urinalysis is recommended.
2. Normal appendix.

## 2015-11-05 ENCOUNTER — Encounter: Payer: Self-pay | Admitting: Emergency Medicine

## 2015-11-05 ENCOUNTER — Emergency Department
Admission: EM | Admit: 2015-11-05 | Discharge: 2015-11-05 | Disposition: A | Discharge status: Home / Self Care | Payer: Medicaid Other | Attending: Emergency Medicine | Admitting: Emergency Medicine

## 2015-11-05 DIAGNOSIS — Z3201 Encounter for pregnancy test, result positive: Secondary | ICD-10-CM | POA: Insufficient documentation

## 2015-11-05 DIAGNOSIS — Z3A00 Weeks of gestation of pregnancy not specified: Secondary | ICD-10-CM | POA: Insufficient documentation

## 2015-11-05 DIAGNOSIS — R300 Dysuria: Secondary | ICD-10-CM | POA: Insufficient documentation

## 2015-11-05 DIAGNOSIS — Z3490 Encounter for supervision of normal pregnancy, unspecified, unspecified trimester: Secondary | ICD-10-CM

## 2015-11-05 DIAGNOSIS — R1030 Lower abdominal pain, unspecified: Secondary | ICD-10-CM | POA: Diagnosis present

## 2015-11-05 LAB — URINALYSIS COMPLETE WITH MICROSCOPIC (ARMC ONLY)
Bilirubin Urine: NEGATIVE
Glucose, UA: NEGATIVE mg/dL
HGB URINE DIPSTICK: NEGATIVE
Ketones, ur: NEGATIVE mg/dL
Nitrite: NEGATIVE
Protein, ur: NEGATIVE mg/dL
Specific Gravity, Urine: 1.027 (ref 1.005–1.030)
pH: 6 (ref 5.0–8.0)

## 2015-11-05 LAB — POCT PREGNANCY, URINE: PREG TEST UR: POSITIVE — AB

## 2015-11-05 MED ORDER — ONDANSETRON HCL 4 MG PO TABS: 4.0000 mg | ORAL_TABLET | Freq: Every day | ORAL | Status: DC | PRN

## 2015-11-05 MED ORDER — PRENATAL VITAMINS 28-0.8 MG PO TABS: 1.0000 | ORAL_TABLET | ORAL | Status: AC

## 2015-11-05 NOTE — ED Notes (Signed)
Pt c/o low midline abd pain for about 1 month, more constant since Monday; c/o dysuria and low back pain; pt has been pregnant before and is pretty sure she's pregnant now and would like that confirmed while she's here; LMP 09/02/15;

## 2015-11-05 NOTE — ED Notes (Addendum)
Patient c/o pelvic pain X 1 month that radiates to lower back, with increase of severity on 3/12. Pt c/o N/V, denies diarrhea. Pt reports pain with urination.

## 2015-11-05 NOTE — ED Provider Notes (Signed)
[facility] Emergency Department Provider Note  ____________________________________________  Time seen: Approximately 10:10 PM  I have reviewed the triage vital signs and the nursing notes.   HISTORY  Chief Complaint Abdominal Pain and Dysuria    HPI [place] P Moffitt is a 20 y.o. female patient complaining of lower mid abdominal pain for 1 month. Patient states she's having some low back pain and dysuria. Patient denies fever, vaginal discharge or bleeding. Patient last LMP was 09/02/2015. Patient is sexually active no birth control methods. She rates her pain discomfort as a 6/10. Patient describes her pain discomfort as "achy".   Past Medical History  Diagnosis Date  . Anemia     There are no active problems to display for this patient.   Past Surgical History  Procedure Laterality Date  . Tonsillectomy    . Nasal sinus surgery      Current Outpatient Rx  Name  Route  Sig  Dispense  Refill  . amoxicillin-clavulanate (AUGMENTIN) 875-125 MG per tablet   Oral   Take 1 tablet by mouth 2 (two) times daily.   20 tablet   0   . Prenatal Vit-Fe Fumarate-FA (PRENATAL VITAMINS) 28-0.8 MG TABS   Oral   Take 1 tablet by mouth 1 day or 1 dose.   30 tablet   0     Allergies Review of patient's allergies indicates no known allergies.  History reviewed. No pertinent family history.  Social History Social History  Substance Use Topics  . Smoking status: Never Smoker   . Smokeless tobacco: None  . Alcohol Use: No    Review of Systems Constitutional: No fever/chills Eyes: No visual changes. ENT: No sore throat. Cardiovascular: Denies chest pain. Respiratory: Denies shortness of breath. Gastrointestinal: Lower abdominal pain.  No nausea, no vomiting.  No diarrhea.  No constipation. Genitourinary: Positive for dysuria. Musculoskeletal: Negative for back pain. Skin: Negative for rash. Neurological: Negative for headaches, focal weakness or  numbness. Hematological/Lymphatic: Allergic/Immunilogical: Anemia  10-point ROS otherwise negative.  ____________________________________________   PHYSICAL EXAM:  VITAL SIGNS: ED Triage Vitals  Enc Vitals Group     BP 11/05/15 2126 132/71 mmHg     Pulse Rate 11/05/15 2126 69     Resp 11/05/15 2126 18     Temp 11/05/15 2126 98.3 F (36.8 C)     Temp Source 11/05/15 2126 Oral     SpO2 11/05/15 2126 100 %     Weight 11/05/15 2126 170 lb (77.111 kg)     Height 11/05/15 2126  (1.753 m)     Head Cir --      Peak Flow --      Pain Score 11/05/15 2127 6     Pain Loc --      Pain Edu? --      Excl. in GC? --     Constitutional: Alert and oriented. Well appearing and in no acute distress. Eyes: Conjunctivae are normal. PERRL. EOMI. Head: Atraumatic. Nose: No congestion/rhinnorhea. Mouth/Throat: Mucous membranes are moist.  Oropharynx non-erythematous. Neck: No stridor.  No cervical spine tenderness to palpation. Hematological/Lymphatic/Immunilogical: No cervical lymphadenopathy. Cardiovascular: Normal rate, regular rhythm. Grossly normal heart sounds.  Good peripheral circulation. Respiratory: Normal respiratory effort.  No retractions. Lungs CTAB. Gastrointestinal: Soft and nontender. No distention. No abdominal bruits. No CVA tenderness. Genitourinary: Deferred Musculoskeletal: No lower extremity tenderness nor edema.  No joint effusions. Neurologic:  Normal speech and language. No gross focal neurologic deficits are appreciated. No gait instability. Skin:  Skin is warm, dry and intact. No rash noted. Psychiatric: Mood and affect are normal. Speech and behavior are normal.  ____________________________________________   LABS (all labs ordered are listed, but only abnormal results are displayed)  Labs Reviewed  URINALYSIS COMPLETEWITH MICROSCOPIC ([facility] ONLY) - Abnormal; Notable for the following:    Color, Urine YELLOW (*)    APPearance HAZY (*)    Leukocytes, UA  2+ (*)    Bacteria, UA RARE (*)    Squamous Epithelial / LPF 6-30 (*)    All other components within normal limits  POCT PREGNANCY, URINE - Abnormal; Notable for the following:    Preg Test, Ur POSITIVE (*)    All other components within normal limits  POC URINE PREG, ED   ____________________________________________  EKG   ____________________________________________  RADIOLOGY   ____________________________________________   PROCEDURES  Procedure(s) performed: None  Critical Care performed: No  ____________________________________________   INITIAL IMPRESSION / ASSESSMENT AND PLAN / ED COURSE  Pertinent labs & imaging results that were available during my care of the patient were reviewed by me and considered in my medical decision making (see chart for details).  Early gestation with dysuria. Discuss lab results with patient. [facility]. She was started on prenatal vitamins today. Patient given prescription for Zofran   FINAL CLINICAL IMPRESSION(S) / ED DIAGNOSES  Final diagnoses:  Early stage of pregnancy      Joni ReiningRonald K Brynlea Spindler, PA-C 11/05/15 2229  Joni Reiningonald K Claudine Stallings, PA-C 11/05/15 2257  Sharyn CreamerMark Quale, MD 11/05/15 [phone]

## 2015-11-05 NOTE — ED Notes (Signed)
  Reviewed d/c instructions, follow-up care, and prescriptions with pt. Pt verbalized understanding 

## 2015-11-05 NOTE — Discharge Instructions (Signed)
First Trimester of Pregnancy °The first trimester of pregnancy is from week 1 until the end of week 12 (months 1 through 3). During this time, your baby will begin to develop inside you. At 6-8 weeks, the eyes and face are formed, and the heartbeat can be seen on ultrasound. At the end of 12 weeks, all the baby's organs are formed. Prenatal care is all the medical care you receive before the birth of your baby. Make sure you get good prenatal care and follow all of your doctor's instructions. °HOME CARE  °Medicines °· Take medicine only as told by your doctor. Some medicines are safe and some are not during pregnancy. °· Take your prenatal vitamins as told by your doctor. °· Take medicine that helps you poop (stool softener) as needed if your doctor says it is okay. °Diet °· Eat regular, healthy meals. °· Your doctor will tell you the amount of weight gain that is right for you. °· Avoid raw meat and uncooked cheese. °· If you feel sick to your stomach (nauseous) or throw up (vomit): °· Eat 4 or 5 small meals a day instead of 3 large meals. °· Try eating a few soda crackers. °· Drink liquids between meals instead of during meals. °· If you have a hard time pooping (constipation): °· Eat high-fiber foods like fresh vegetables, fruit, and whole grains. °· Drink enough fluids to keep your pee (urine) clear or pale yellow. °Activity and Exercise °· Exercise only as told by your doctor. Stop exercising if you have cramps or pain in your lower belly (abdomen) or low back. °· Try to avoid standing for long periods of time. Move your legs often if you must stand in one place for a long time. °· Avoid heavy lifting. °· Wear low-heeled shoes. Sit and stand up straight. °· You can have sex unless your doctor tells you not to. °Relief of Pain or Discomfort °· Wear a good support bra if your breasts are sore. °· Take warm water baths (sitz baths) to soothe pain or discomfort caused by hemorrhoids. Use hemorrhoid cream if your  doctor says it is okay. °· Rest with your legs raised if you have leg cramps or low back pain. °· Wear support hose if you have puffy, bulging veins (varicose veins) in your legs. Raise (elevate) your feet for 15 minutes, 3-4 times a day. Limit salt in your diet. °Prenatal Care °· Schedule your prenatal visits by the twelfth week of pregnancy. °· Write down your questions. Take them to your prenatal visits. °· Keep all your prenatal visits as told by your doctor. °Safety °· Wear your seat belt at all times when driving. °· Make a list of emergency phone numbers. The list should include numbers for family, friends, the hospital, and police and fire departments. °General Tips °· Ask your doctor for a referral to a local prenatal class. Begin classes no later than at the start of month 6 of your pregnancy. °· Ask for help if you need counseling or help with nutrition. Your doctor can give you advice or tell you where to go for help. °· Do not use hot tubs, steam rooms, or saunas. °· Do not douche or use tampons or scented sanitary pads. °· Do not cross your legs for long periods of time. °· Avoid litter boxes and soil used by cats. °· Avoid all smoking, herbs, and alcohol. Avoid drugs not approved by your doctor. °· Do not use any tobacco products, including cigarettes,   chewing tobacco, and electronic cigarettes. If you need help quitting, ask your doctor. You may get counseling or other support to help you quit.  Visit your dentist. At home, brush your teeth with a soft toothbrush. Be gentle when you floss. GET HELP IF:  You are dizzy.  You have mild cramps or pressure in your lower belly.  You have a nagging pain in your belly area.  You continue to feel sick to your stomach, throw up, or have watery poop (diarrhea).  You have a bad smelling fluid coming from your vagina.  You have pain with peeing (urination).  You have increased puffiness (swelling) in your face, hands, legs, or ankles. GET HELP  RIGHT AWAY IF:   You have a fever.  You are leaking fluid from your vagina.  You have spotting or bleeding from your vagina.  You have very bad belly cramping or pain.  You gain or lose weight rapidly.  You throw up blood. It may look like coffee grounds.  You are around people who have [place] measles, fifth disease, or chickenpox.  You have a very bad headache.  You have shortness of breath.  You have any kind of trauma, such as from a fall or a car accident.   This information is not intended to replace advice given to you by your health care provider. Make sure you discuss any questions you have with your health care provider.   Document Released: 01/23/2008 Document Revised: 08/27/2014 Document Reviewed: 06/16/2013 Elsevier Interactive Patient Education 2016 Elsevier Inc.  Back Pain in Pregnancy Back pain during pregnancy is common. It happens in about half of all pregnancies. It is important for you and your baby that you remain active during your pregnancy.If you feel that back pain is not allowing you to remain active or sleep well, it is time to see your caregiver. Back pain may be caused by several factors related to changes during your pregnancy.Fortunately, unless you had trouble with your back before your pregnancy, the pain is likely to get better after you deliver. Low back pain usually occurs between the fifth and seventh months of pregnancy. It can, however, happen in the first couple months. Factors that increase the risk of back problems include:   Previous back problems.  Injury to your back.  Having twins or multiple births.  A chronic cough.  Stress.  Job-related repetitive motions.  Muscle or spinal disease in the back.  Family history of back problems, ruptured (herniated) discs, or osteoporosis.  Depression, anxiety, and panic attacks. CAUSES   When you are pregnant, your body produces a hormone called relaxin. This hormonemakes the  ligaments connecting the low back and pubic bones more flexible. This flexibility allows the baby to be delivered more easily. When your ligaments are loose, your muscles need to work harder to support your back. Soreness in your back can come from tired muscles. Soreness can also come from back tissues that are irritated since they are receiving less support.  As the baby grows, it puts pressure on the nerves and blood vessels in your pelvis. This can cause back pain.  As the baby grows and gets heavier during pregnancy, the uterus pushes the stomach muscles forward and changes your center of gravity. This makes your back muscles work harder to maintain good posture. SYMPTOMS  Lumbar pain during pregnancy Lumbar pain during pregnancy usually occurs at or above the waist in the center of the back. There may be pain and numbness that  radiates into your leg or foot. This is similar to low back pain experienced by non-pregnant women. It usually increases with sitting for long periods of time, standing, or repetitive lifting. Tenderness may also be present in the muscles along your upper back. Posterior pelvic pain during pregnancy Pain in the back of the pelvis is more common than lumbar pain in pregnancy. It is a deep pain felt in your side at the waistline, or across the tailbone (sacrum), or in both places. You may have pain on one or both sides. This pain can also go into the buttocks and backs of the upper thighs. Pubic and groin pain may also be present. The pain does not quickly resolve with rest, and morning stiffness may also be present. Pelvic pain during pregnancy can be brought on by most activities. A high level of fitness before and during pregnancy may or may not prevent this problem. Labor pain is usually 1 to 2 minutes apart, lasts for about 1 minute, and involves a bearing down feeling or pressure in your pelvis. However, if you are at term with the pregnancy, constant low back pain can be  the beginning of early labor, and you should be aware of this. DIAGNOSIS  X-rays of the back should not be done during the first 12 to 14 weeks of the pregnancy and only when absolutely necessary during the rest of the pregnancy. MRIs do not give off radiation and are safe during pregnancy. MRIs also should only be done when absolutely necessary. HOME CARE INSTRUCTIONS  Exercise as directed by your caregiver. Exercise is the most effective way to prevent or manage back pain. If you have a back problem, it is especially important to avoid sports that require sudden body movements. Swimming and walking are great activities.  Do not stand in one place for long periods of time.  Do not wear high heels.  Sit in chairs with good posture. Use a pillow on your lower back if necessary. Make sure your head rests over your shoulders and is not hanging forward.  Try sleeping on your side, preferably the left side, with a pillow or two between your legs. If you are sore after a night's rest, your bedmay betoo soft.Try placing a board between your mattress and box spring.  Listen to your body when lifting.If you are experiencing pain, ask for help or try bending yourknees more so you can use your leg muscles rather than your back muscles. Squat down when picking up something from the floor. Do not bend over.  Eat a healthy diet. Try to gain weight within your caregiver's recommendations.  Use heat or cold packs 3 to 4 times a day for 15 minutes to help with the pain.  Only take over-the-counter or prescription medicines for pain, discomfort, or fever as directed by your caregiver. Sudden (acute) back pain  Use bed rest for only the most extreme, acute episodes of back pain. Prolonged bed rest over 48 hours will aggravate your condition.  Ice is very effective for acute conditions.  Put ice in a plastic bag.  Place a towel between your skin and the bag.  Leave the ice on for 10 to 20 minutes  every 2 hours, or as needed.  Using heat packs for 30 minutes prior to activities is also helpful. Continued back pain See your caregiver if you have continued problems. Your caregiver can help or refer you for appropriate physical therapy. With conditioning, most back problems can be avoided.  Sometimes, a more serious issue may be the cause of back pain. You should be seen right away if new problems seem to be developing. Your caregiver may recommend:  A maternity girdle.  An elastic sling.  A back brace.  A massage therapist or acupuncture. SEEK MEDICAL CARE IF:   You are not able to do most of your daily activities, even when taking the pain medicine you were given.  You need a referral to a physical therapist or chiropractor.  You want to try acupuncture. SEEK IMMEDIATE MEDICAL CARE IF:  You develop numbness, tingling, weakness, or problems with the use of your arms or legs.  You develop severe back pain that is no longer relieved with medicines.  You have a sudden change in bowel or bladder control.  You have increasing pain in other areas of the body.  You develop shortness of breath, dizziness, or fainting.  You develop nausea, vomiting, or sweating.  You have back pain which is similar to labor pains.  You have back pain along with your water breaking or vaginal bleeding.  You have back pain or numbness that travels down your leg.  Your back pain developed after you fell.  You develop pain on one side of your back. You may have a kidney stone.  You see blood in your urine. You may have a bladder infection or kidney stone.  You have back pain with blisters. You may have shingles. Back pain is fairly common during pregnancy but should not be accepted as just part of the process. Back pain should always be treated as soon as possible. This will make your pregnancy as pleasant as possible.   This information is not intended to replace advice given to you by your  health care provider. Make sure you discuss any questions you have with your health care provider.   Document Released: 11/14/2005 Document Revised: 10/29/2011 Document Reviewed: 12/26/2010 Elsevier Interactive Patient Education [employer]

## 2016-01-24 ENCOUNTER — Other Ambulatory Visit: Payer: Self-pay | Admitting: Family Medicine

## 2016-01-24 DIAGNOSIS — Z3689 Encounter for other specified antenatal screening: Secondary | ICD-10-CM

## 2016-01-27 ENCOUNTER — Ambulatory Visit
Admission: RE | Admit: 2016-01-27 | Discharge: 2016-01-27 | Disposition: A | Discharge status: Home / Self Care | Payer: Medicaid Other | Source: Ambulatory Visit | Attending: Family Medicine | Admitting: Family Medicine

## 2016-01-27 DIAGNOSIS — Z3689 Encounter for other specified antenatal screening: Secondary | ICD-10-CM

## 2016-01-27 DIAGNOSIS — Z36 Encounter for antenatal screening of mother: Secondary | ICD-10-CM | POA: Insufficient documentation

## 2016-01-27 DIAGNOSIS — Z3A21 21 weeks gestation of pregnancy: Secondary | ICD-10-CM | POA: Diagnosis not present

## 2016-02-28 ENCOUNTER — Other Ambulatory Visit: Payer: Self-pay | Admitting: Family Medicine

## 2016-02-28 DIAGNOSIS — Z3490 Encounter for supervision of normal pregnancy, unspecified, unspecified trimester: Secondary | ICD-10-CM

## 2016-03-01 ENCOUNTER — Ambulatory Visit: Payer: Medicaid Other

## 2016-03-13 ENCOUNTER — Other Ambulatory Visit: Payer: Self-pay | Admitting: Oncology

## 2016-03-13 DIAGNOSIS — D509 Iron deficiency anemia, unspecified: Secondary | ICD-10-CM

## 2016-03-14 ENCOUNTER — Inpatient Hospital Stay: Payer: Medicaid Other | Admitting: Oncology

## 2016-03-14 ENCOUNTER — Inpatient Hospital Stay: Payer: Medicaid Other

## 2016-04-10 ENCOUNTER — Encounter (INDEPENDENT_AMBULATORY_CARE_PROVIDER_SITE_OTHER): Payer: Self-pay

## 2016-04-10 ENCOUNTER — Inpatient Hospital Stay: Payer: Medicaid Other

## 2016-04-10 ENCOUNTER — Encounter: Payer: Self-pay | Admitting: Oncology

## 2016-04-10 ENCOUNTER — Inpatient Hospital Stay: Payer: Medicaid Other | Attending: Oncology | Admitting: Oncology

## 2016-04-10 VITALS — BP 131/76 | HR 90 | Temp 97.5°F | Resp 16 | Wt 180.2 lb

## 2016-04-10 VITALS — BP 130/76 | HR 88 | Temp 97.6°F | Resp 16

## 2016-04-10 DIAGNOSIS — D509 Iron deficiency anemia, unspecified: Secondary | ICD-10-CM

## 2016-04-10 DIAGNOSIS — Z87891 Personal history of nicotine dependence: Secondary | ICD-10-CM | POA: Diagnosis not present

## 2016-04-10 DIAGNOSIS — Z79899 Other long term (current) drug therapy: Secondary | ICD-10-CM | POA: Diagnosis not present

## 2016-04-10 DIAGNOSIS — O99013 Anemia complicating pregnancy, third trimester: Secondary | ICD-10-CM

## 2016-04-10 MED ORDER — SODIUM CHLORIDE 0.9 % IV SOLN
Freq: Once | INTRAVENOUS | Status: AC
  Administered 2016-04-10: 13:00:00 via INTRAVENOUS
  Filled 2016-04-10: qty 1000

## 2016-04-10 MED ORDER — SODIUM CHLORIDE 0.9 % IV SOLN
510.0000 mg | Freq: Once | INTRAVENOUS | Status: AC
  Administered 2016-04-10: 510 mg via INTRAVENOUS
  Filled 2016-04-10: qty 17

## 2016-04-11 NOTE — Progress Notes (Signed)
[facility]  Telephone:(336) [phone] Fax:(336) [phone]  ID: Joy PresumeBrittany P Berry OB: [birth year]  MR#: [id number]  ACZ#:[id number]#:[id number]  Patient Care Team: [facility] as [facility]CP - General  CHIEF COMPLAINT: Iron deficiency anemia in pregnancy.  INTERVAL HISTORY: Patient is a 20 year old female whose last evaluated in clinic approximately 2 years ago. She was noted to have iron deficiency anemia and her first pregnancy. Routine lab work in her third trimester revealed a significantly decreased hemoglobin as well as decreased ferritin level. Currently she feels well and is asymptomatic. She does not complain of weakness or fatigue. She has no neurologic complaints. She denies any recent fevers. She is gaining weight appropriately. She has no chest pain or shortness of breath. She denies any nausea, vomiting, constipation, or diarrhea. She has no urinary complaints. Patient offers no specific complaints today.  REVIEW OF SYSTEMS:   Review of Systems  Constitutional: Negative.  Negative for fever, malaise/fatigue and weight loss.  Respiratory: Negative.  Negative for cough and shortness of breath.   Cardiovascular: Negative.  Negative for chest pain.  Gastrointestinal: Negative.  Negative for abdominal pain, blood in stool and melena.  Genitourinary: Negative.   Musculoskeletal: Negative.   Neurological: Negative.   Psychiatric/Behavioral: Negative.  The patient is not nervous/anxious.     As per HPI. Otherwise, a complete review of systems is negatve.  PAST MEDICAL HISTORY: Past Medical History:  Diagnosis Date  . Anemia     PAST SURGICAL HISTORY: Past Surgical History:  Procedure Laterality Date  . NASAL SINUS SURGERY    . TONSILLECTOMY      FAMILY HISTORY: Reviewed and unchanged. No reported history of malignancy or chronic disease.     ADVANCED DIRECTIVES (Y/N):  N   HEALTH MAINTENANCE: Social History  Substance Use Topics  . Smoking  status: Former Smoker    Years: 2.00    Types: Cigars    Quit date: 11/19/2015  . Smokeless tobacco: Never Used  . Alcohol use No     Comment: before pregnant-occassional drinker liquour     Colonoscopy:  PAP:  Bone density:  Lipid panel:  No Known Allergies  Current Outpatient Prescriptions  Medication Sig Dispense Refill  . amoxicillin-clavulanate (AUGMENTIN) 875-125 MG per tablet Take 1 tablet by mouth 2 (two) times daily. (Patient not taking: Reported on 04/10/2016) 20 tablet 0  . ondansetron (ZOFRAN) 4 MG tablet Take 1 tablet (4 mg total) by mouth daily as needed for nausea or vomiting. (Patient not taking: Reported on 04/10/2016) 30 tablet 1  . Prenatal Vit-Fe Fumarate-FA (PRENATAL VITAMINS) 28-0.8 MG TABS Take 1 tablet by mouth 1 day or 1 dose. (Patient not taking: Reported on 04/10/2016) 30 tablet 0   No current facility-administered medications for this visit.     OBJECTIVE: Vitals:   04/10/16 1124  BP: 131/76  Pulse: 90  Resp: 16  Temp: 97.5 F (36.4 C)     Body mass index is 26.61 kg/m.    ECOG FS:0 - Asymptomatic  General: Well-developed, well-nourished, no acute distress. Eyes: Pink conjunctiva, anicteric sclera. HEENT: Normocephalic, moist mucous membranes, clear oropharnyx. Lungs: Clear to auscultation bilaterally. Heart: Regular rate and rhythm. No rubs, murmurs, or gallops. Abdomen: Appears appropriate for gestational age. Musculoskeletal: No edema, cyanosis, or clubbing. Neuro: Alert, answering all questions appropriately. Cranial nerves grossly intact. Skin: No rashes or petechiae noted. Psych: Normal affect. Lymphatics: No cervical, calvicular, axillary or inguinal LAD.   LAB RESULTS:  Lab Results  Component Value Date  NA 138 08/17/2014   K 3.1 (L) 08/17/2014   CL 106 08/17/2014   CO2 23 08/17/2014   GLUCOSE 114 (H) 08/17/2014   BUN 9 08/17/2014   CREATININE 1.06 08/17/2014   CALCIUM 8.6 08/17/2014   PROT 7.5 08/17/2014   ALBUMIN 3.2 (L)  08/17/2014   AST 22 08/17/2014   ALT 27 08/17/2014   ALKPHOS 121 (H) 08/17/2014   BILITOT 1.3 (H) 08/17/2014   GFRNONAA >60 08/17/2014   GFRAA >60 08/17/2014    Lab Results  Component Value Date   WBC 18.6 (H) 08/17/2014   NEUTROABS 14.7 (H) 08/17/2014   HGB 11.3 (L) 08/17/2014   HCT 35.6 08/17/2014   MCV 83 08/17/2014   PLT 271 08/17/2014     STUDIES: No results found.  ASSESSMENT: Iron deficiency anemia in pregnancy.  PLAN:    1. Iron deficiency anemia in pregnancy: Patient is in her third trimester pregnancy and found to have significantly reduced hemoglobin 7.3 as well as a ferritin level of 8. Patient was also noted to be iron deficient with her first pregnancy several years ago. Proceed with 510 mg IV Feraheme today and then return to clinic in 1 week for a second infusion. Patient is due date is at the beginning of October, therefore she will return to clinic at the end of September for repeat laboratory work, further evaluation, and consideration of additional IV iron. If her daughter is born early, this appointment will be canceled and patient will be evaluated 3 months postpartum. 2. Pregnancy: Patient's due date is in early October. She is anticipating a natural birth with her second daughter.   Patient expressed understanding and was in agreement with this plan. She also understands that She can call clinic at any time with any questions, concerns, or complaints.    Jeralyn Ruthsimothy J Labrandon Knoch, MD   04/11/2016 10:28 PM

## 2016-04-17 ENCOUNTER — Inpatient Hospital Stay: Payer: Medicaid Other

## 2016-04-17 VITALS — BP 138/86 | HR 82 | Temp 96.3°F | Resp 20

## 2016-04-17 DIAGNOSIS — D509 Iron deficiency anemia, unspecified: Secondary | ICD-10-CM

## 2016-04-17 DIAGNOSIS — O99013 Anemia complicating pregnancy, third trimester: Secondary | ICD-10-CM | POA: Diagnosis not present

## 2016-04-17 MED ORDER — SODIUM CHLORIDE 0.9 % IV SOLN
Freq: Once | INTRAVENOUS | Status: AC
  Administered 2016-04-17: 14:00:00 via INTRAVENOUS
  Filled 2016-04-17: qty 1000

## 2016-04-17 MED ORDER — SODIUM CHLORIDE 0.9 % IV SOLN
510.0000 mg | Freq: Once | INTRAVENOUS | Status: AC
  Administered 2016-04-17: 510 mg via INTRAVENOUS
  Filled 2016-04-17: qty 17

## 2016-04-20 ENCOUNTER — Emergency Department
Admission: EM | Admit: 2016-04-20 | Discharge: 2016-04-20 | Disposition: A | Discharge status: Home / Self Care | Payer: Medicaid Other | Attending: Emergency Medicine | Admitting: Emergency Medicine

## 2016-04-20 DIAGNOSIS — M549 Dorsalgia, unspecified: Secondary | ICD-10-CM

## 2016-04-20 DIAGNOSIS — Z87891 Personal history of nicotine dependence: Secondary | ICD-10-CM | POA: Insufficient documentation

## 2016-04-20 DIAGNOSIS — M542 Cervicalgia: Secondary | ICD-10-CM | POA: Insufficient documentation

## 2016-04-20 DIAGNOSIS — M25512 Pain in left shoulder: Secondary | ICD-10-CM | POA: Diagnosis not present

## 2016-04-20 DIAGNOSIS — Z3A35 35 weeks gestation of pregnancy: Secondary | ICD-10-CM | POA: Insufficient documentation

## 2016-04-20 DIAGNOSIS — O26893 Other specified pregnancy related conditions, third trimester: Secondary | ICD-10-CM | POA: Diagnosis not present

## 2016-04-20 MED ORDER — CYCLOBENZAPRINE HCL 5 MG PO TABS: 5.0000 mg | ORAL_TABLET | Freq: Three times a day (TID) | ORAL | 0 refills | Status: DC | PRN

## 2016-04-20 MED ORDER — CYCLOBENZAPRINE HCL 10 MG PO TABS
5.0000 mg | ORAL_TABLET | Freq: Once | ORAL | Status: AC
  Administered 2016-04-20: 5 mg via ORAL
  Filled 2016-04-20: qty 1

## 2016-04-20 MED ORDER — CYCLOBENZAPRINE HCL 5 MG PO TABS: 5.0000 mg | ORAL_TABLET | Freq: Three times a day (TID) | ORAL | 0 refills | Status: AC | PRN

## 2016-04-20 MED ORDER — BUTALBITAL-APAP-CAFFEINE 50-325-40 MG PO TABS
1.0000 | ORAL_TABLET | Freq: Once | ORAL | Status: AC
  Administered 2016-04-20: 1 via ORAL
  Filled 2016-04-20: qty 1

## 2016-04-20 NOTE — Discharge Instructions (Signed)
Please seek medical attention for any high fevers, chest pain, shortness of breath, change in behavior, persistent vomiting, bloody stool or any other new or concerning symptoms.  

## 2016-04-20 NOTE — ED Triage Notes (Signed)
Pt c/o left upper back/neck pain for the past 2 days, denies injury.. States it hurts to turn her neck and lift her left arm.. Pt is [redacted] weeks pregnant.. States she is not having contractions, this is not her first pregnancy.

## 2016-04-20 NOTE — ED Provider Notes (Signed)
[facility] Emergency Department Provider Note   ____________________________________________   I have reviewed the triage vital signs and the nursing notes.   HISTORY  Chief Complaint Shoulder Pain   History limited by: Not Limited   HPI GrenadaBrittany P Dan HumphreysWalker is a 20 y.o. female who presents to the emergency department today because of concerns for left shoulder, neck and upper back pain. Patient states that the symptoms started roughly 2 days ago. She does not recall any trauma to her body. No unusual or heavy lifting. The patient states that she has tried Tylenol or ibuprofen as well as a unknown pain medication that she got from someone else. None of these medications helped. No significant shortness of breath. No cough. No recent travel. No fevers.   Past Medical History:  Diagnosis Date  . Anemia     Patient Active Problem List   Diagnosis Date Noted  . Iron deficiency anemia 03/13/2016    Past Surgical History:  Procedure Laterality Date  . NASAL SINUS SURGERY    . TONSILLECTOMY      Prior to Admission medications   Medication Sig Start Date End Date Taking? Authorizing Provider  amoxicillin-clavulanate (AUGMENTIN) 875-125 MG per tablet Take 1 tablet by mouth 2 (two) times daily. Patient not taking: Reported on 04/10/2016 01/27/15   Chinita Pesterari B Triplett, FNP  ondansetron (ZOFRAN) 4 MG tablet Take 1 tablet (4 mg total) by mouth daily as needed for nausea or vomiting. Patient not taking: Reported on 04/10/2016 11/05/15 11/04/16  Joni Reiningonald K Smith, PA-C  Prenatal Vit-Fe Fumarate-FA (PRENATAL VITAMINS) 28-0.8 MG TABS Take 1 tablet by mouth 1 day or 1 dose. Patient not taking: Reported on 04/10/2016 11/05/15   Joni Reiningonald K Smith, PA-C    Allergies Review of patient's allergies indicates no known allergies.  No family history on file.  Social History Social History  Substance Use Topics  . Smoking status: Former Smoker    Years: 2.00    Types: Cigars    Quit  date: 11/19/2015  . Smokeless tobacco: Never Used  . Alcohol use No     Comment: before pregnant-occassional drinker liquour    Review of Systems  Constitutional: Negative for fever. Cardiovascular: Negative for chest pain. Respiratory: Negative for shortness of breath. Gastrointestinal: Negative for abdominal pain, vomiting and diarrhea. Genitourinary: Negative for dysuria. Musculoskeletal: Positive for upper left back. Skin: Negative for rash. Neurological: Negative for headaches, focal weakness or numbness.  10-point ROS otherwise negative.  ____________________________________________   PHYSICAL EXAM:  VITAL SIGNS: ED Triage Vitals  Enc Vitals Group     BP 04/20/16 0901 131/75     Pulse Rate 04/20/16 0901 87     Resp 04/20/16 0901 17     Temp 04/20/16 0901 98.4 F (36.9 C)     Temp Source 04/20/16 0901 Oral     SpO2 04/20/16 0901 100 %     Weight 04/20/16 0856 180 lb (81.6 kg)     Height 04/20/16 0856 5\' 7"  (1.702 m)     Head Circumference --      Peak Flow --      Pain Score 04/20/16 0857 8   Constitutional: Alert and oriented. Well appearing and in no distress. Eyes: Conjunctivae are normal. Normal extraocular movements. ENT   Head: Normocephalic and atraumatic.   Nose: No congestion/rhinnorhea.   Mouth/Throat: Mucous membranes are moist.   Neck: No stridor. Hematological/Lymphatic/Immunilogical: No cervical lymphadenopathy. Cardiovascular: Normal rate, regular rhythm.  No murmurs, rubs, or gallops. Respiratory:  Normal respiratory effort without tachypnea nor retractions. Breath sounds are clear and equal bilaterally. No wheezes/rales/rhonchi. Gastrointestinal: Soft and nontender. No distention.  Genitourinary: Deferred Musculoskeletal: Slightly tender to palpation around the left scapula. No erythema or fluctuance appreciated. Neurologic:  Normal speech and language. No gross focal neurologic deficits are appreciated.  Skin:  Skin is warm, dry  and intact. No rash noted. Psychiatric: Mood and affect are normal. Speech and behavior are normal. Patient exhibits appropriate insight and judgment.  ____________________________________________    LABS (pertinent positives/negatives)  None  ____________________________________________   EKG  None  ____________________________________________    RADIOLOGY  None  ____________________________________________   PROCEDURES  Procedures  ____________________________________________   INITIAL IMPRESSION / ASSESSMENT AND PLAN / ED COURSE  Pertinent labs & imaging results that were available during my care of the patient were reviewed by me and considered in my medical decision making (see chart for details).  Patient presented to the emergency department today because of concerns for left upper back, shoulder and neck pain. On exam she has mild tenderness around the left scapula. No shortness of breath and lung sounds are clear. Patient without any hypoxia or tachycardia here in the emergency department. At this point I do think the pain is likely muscular skeletal nature. Doubt pneumonia given lack of fever. Doubt pulmonary embolism. I did however discuss this possibility with the patient. We did discuss return precautions. I did discuss CT scan however at this point she would like to defer given risk of radiation. Will discharge with Flexeril since she did get some relief. ____________________________________________   FINAL CLINICAL IMPRESSION(S) / ED DIAGNOSES  Final diagnoses:  Back pain, unspecified location     Note: This dictation was prepared with Dragon dictation. Any transcriptional errors that result from this process are unintentional    Phineas Semen, MD 04/20/16 1322

## 2016-04-20 NOTE — ED Notes (Signed)
Patient states that she is [redacted] weeks pregnant. 2 Days ago she started having pain in her upper back and left scapular area. Pain is worse when patient tries to turn her head or look over her shoulder. Patient denies any injury to the area. Patient states that she has numbness and tingling in her hand. Patient with positive pulses and good grip strength bilaterally

## 2016-05-09 DIAGNOSIS — O99013 Anemia complicating pregnancy, third trimester: Secondary | ICD-10-CM | POA: Insufficient documentation

## 2016-05-09 DIAGNOSIS — D509 Iron deficiency anemia, unspecified: Secondary | ICD-10-CM | POA: Insufficient documentation

## 2016-05-10 ENCOUNTER — Inpatient Hospital Stay
Admission: EM | Admit: 2016-05-10 | Discharge: 2016-05-10 | Disposition: A | Discharge status: Home / Self Care | Payer: Medicaid Other | Attending: Obstetrics and Gynecology | Admitting: Obstetrics and Gynecology

## 2016-05-10 DIAGNOSIS — F419 Anxiety disorder, unspecified: Secondary | ICD-10-CM | POA: Insufficient documentation

## 2016-05-10 DIAGNOSIS — O99342 Other mental disorders complicating pregnancy, second trimester: Secondary | ICD-10-CM | POA: Diagnosis not present

## 2016-05-10 DIAGNOSIS — E876 Hypokalemia: Secondary | ICD-10-CM | POA: Diagnosis not present

## 2016-05-10 DIAGNOSIS — O99332 Smoking (tobacco) complicating pregnancy, second trimester: Secondary | ICD-10-CM | POA: Insufficient documentation

## 2016-05-10 DIAGNOSIS — Z3A21 21 weeks gestation of pregnancy: Secondary | ICD-10-CM | POA: Diagnosis not present

## 2016-05-10 DIAGNOSIS — F17290 Nicotine dependence, other tobacco product, uncomplicated: Secondary | ICD-10-CM | POA: Diagnosis not present

## 2016-05-10 DIAGNOSIS — O99012 Anemia complicating pregnancy, second trimester: Secondary | ICD-10-CM | POA: Insufficient documentation

## 2016-05-10 DIAGNOSIS — D509 Iron deficiency anemia, unspecified: Secondary | ICD-10-CM | POA: Diagnosis not present

## 2016-05-10 LAB — PROTEIN / CREATININE RATIO, URINE
CREATININE, URINE: 95 mg/dL
Protein Creatinine Ratio: 0.41 mg/mg{Cre} — ABNORMAL HIGH (ref 0.00–0.15)
Total Protein, Urine: 39 mg/dL

## 2016-05-10 LAB — COMPREHENSIVE METABOLIC PANEL
ALT: 47 U/L (ref 14–54)
AST: 35 U/L (ref 15–41)
Albumin: 2.8 g/dL — ABNORMAL LOW (ref 3.5–5.0)
Alkaline Phosphatase: 149 U/L — ABNORMAL HIGH (ref 38–126)
Anion gap: 6 (ref 5–15)
BILIRUBIN TOTAL: 0.6 mg/dL (ref 0.3–1.2)
BUN: 5 mg/dL — AB (ref 6–20)
CALCIUM: 9 mg/dL (ref 8.9–10.3)
CO2: 23 mmol/L (ref 22–32)
CREATININE: 0.4 mg/dL — AB (ref 0.44–1.00)
Chloride: 107 mmol/L (ref 101–111)
GFR calc Af Amer: >60 mL/min (ref >60)
Glucose, Bld: 97 mg/dL (ref 65–99)
Potassium: 3.2 mmol/L — ABNORMAL LOW (ref 3.5–5.1)
Sodium: 136 mmol/L (ref 135–145)
TOTAL PROTEIN: 6.9 g/dL (ref 6.5–8.1)

## 2016-05-10 LAB — URINE DRUG SCREEN, QUALITATIVE (ARMC ONLY)
Amphetamines, Ur Screen: NOT DETECTED
BARBITURATES, UR SCREEN: NOT DETECTED
Benzodiazepine, Ur Scrn: NOT DETECTED
CANNABINOID 50 NG, UR ~~LOC~~: NOT DETECTED
Cocaine Metabolite,Ur ~~LOC~~: NOT DETECTED
MDMA (Ecstasy)Ur Screen: NOT DETECTED
Methadone Scn, Ur: NOT DETECTED
OPIATE, UR SCREEN: NOT DETECTED
PHENCYCLIDINE (PCP) UR S: NOT DETECTED
Tricyclic, Ur Screen: NOT DETECTED

## 2016-05-10 LAB — CBC
HEMATOCRIT: 28 % — AB (ref 35.0–47.0)
Hemoglobin: 9.3 g/dL — ABNORMAL LOW (ref 12.0–16.0)
MCH: 25.7 pg — AB (ref 26.0–34.0)
MCHC: 33 g/dL (ref 32.0–36.0)
MCV: 77.7 fL — AB (ref 80.0–100.0)
Platelets: 285 10*3/uL (ref 150–440)
RBC: 3.6 MIL/uL — AB (ref 3.80–5.20)
RDW: 29.3 % — ABNORMAL HIGH (ref 11.5–14.5)
WBC: 7.9 10*3/uL (ref 3.6–11.0)

## 2016-05-10 MED ORDER — LABETALOL HCL 5 MG/ML IV SOLN: 20.0000 mg | INTRAVENOUS | Status: DC | PRN

## 2016-05-10 MED ORDER — POTASSIUM CHLORIDE 20 MEQ PO PACK
40.0000 meq | PACK | Freq: Once | ORAL | Status: AC
  Administered 2016-05-10: 40 meq via ORAL
  Filled 2016-05-10: qty 2

## 2016-05-10 MED ORDER — HYDRALAZINE HCL 20 MG/ML IJ SOLN: 10.0000 mg | Freq: Once | INTRAMUSCULAR | Status: DC | PRN

## 2016-05-10 NOTE — H&P (Signed)
20 yo G2P1001 [facility]. Pt has not seen [facility] for some time as there are no records since June 2017. LMP of 08/21/15  and EDD by dating of 05/27/16. US at 21 2/7 weeks with EDD of 06/06/16. HPI:  Pt states she felt like she passed out yest for a few seconds and took her BP and it was 140/93. Pt also had prior episode of passing out briefly. She also has anxiety attacks and has SOB with those.  Past Medical History:  Diagnosis Date  . Anemia   Anxiety Past Surgical History:  Procedure Laterality Date  . NASAL SINUS SURGERY    . TONSILLECTOMY     Family History  Problem Relation Age of Onset  . Hypertension Paternal Grandmother   Dad: Type 2 DM Social History   Social History  . Marital status: Single    Spouse name: N/A  . Number of children: N/A  . Years of education: N/A   Occupational History  . Not on file.   Social History Main Topics  . Smoking status: Current Some Day Smoker    Years: 2.00    Types: Cigars, Cigarettes    Last attempt to quit: 11/19/2015  . Smokeless tobacco: Never Used  . Alcohol use No     Comment: before pregnant-occassional drinker liquour  . Drug use: No  . Sexual activity: Yes   Other Topics Concern  . Not on file   Social History Narrative  . No narrative on file  Gen: 20 yo black female in NAD. Heart: Grade 4/6 systolic murmur heard. Lungs: CTA bilat, no W/R/R. Abd: Gravid, No UC's seen Extrems: DTR's 1+/0 A: 1. IUP at 37 weeks 2. Elevated Protein/Creatine ratio 410 P; 1. Normal BP's today in birthplace 140/80, No HA's, No RUQ pain, no visual disturbances. 2. EKG: NSR 3. Hypokalemia 3.2 given KCL 40 mes po x 1 4. Pt has poss kidney affects from prior pre-ecclampsia or other unknown disease as her BP's are no indicative of pre-ecclampsia. 5. Pt tol liqs and crackers. 6. Will discuss with Dr Felicita GageJS and determine if getting a 24 h urine for protein/creatinine is  acceptable.

## 2016-05-10 NOTE — Progress Notes (Signed)
Called [facility] 24 hour hotline at [phone] and sent to [employer] at [phone].First call was hung up on. The next call I waited on the phone for 15 mins and the operator took a message for the on call nurse practitioner to call back since she would not answer.  Disc with Dr Feliberto GottronSchermerhorn and attempting to get more information about pt. No cardiology workup has been done in the past.  BP is stable and the range is: 120/72-110/57. Pt had severe pre-ecclampsia with first pregnancy with elevated protein/creatinine of 1226.

## 2016-05-10 NOTE — OB Triage Note (Signed)
Patient came in today c/o contractions. Pt reports contractions began Monday evening. Since then contractions are closer together per patient. Right now reports they are Q30 mins. Patient reports a "blackout" episode Monday and last night as well. She felt dizzy and felt unconscious for a few seconds. Patient took her BP at home after those blackouts and reports they were in the 140s/90s. Reports positive fetal movement. Denies vaginal bleeding.   UA and toco placed. FHR in the 140s. Category I strip.

## 2016-05-10 NOTE — Discharge Summary (Signed)
Obstetric Discharge Summary   Patient ID: Cindra PresumeBrittany P Saintjean MRN: [id number] DOB/AGE: Oct 24, 1995 20 y.o.   Date of Admission: 05/10/2016  Date of Discharge: 05/10/16  Admitting Diagnosis: IUP at 3037w4d  Secondary Diagnosis: Elevated P:C ratio  Mode of Delivery:Discharge Diagnosis:  Pt is Antepartum Intrapartum Procedures:    Post partum procedures: none  Complications: Protein/creatinine ratio is 410   Brief Hospital Course  GrenadaBrittany P Dan HumphreysWalker is a G2P1001 who arrived here today for  Labs: CBC Latest Ref Rng & Units 05/10/2016 08/17/2014 06/24/2014  WBC 3.6 - 11.0 K/uL 7.9 18.6(H) 12.9(H)  Hemoglobin 12.0 - 16.0 g/dL 4.0(J9.3(L) 11.3(L) 8.8(L)  Hematocrit 35.0 - 47.0 % 28.0(L) 35.6 28.3(L)  Platelets 150 - 440 K/uL 285 271 230    Physical exam:  Blood pressure 120/79, pulse 89, temperature 98.1 F (36.7 C), temperature source Oral, resp. rate 16, last menstrual period 09/02/2015. General: alert and no distress Lochia: appropriate Abdomen: soft, NT Uterine Fundus: firm Extremities: No evidence of DVT seen on physical exam. No lower extremity edema.  Discharge Instructions: Per After Visit Summary. Activity: Advance as tolerated. Pelvic rest for 6 weeks.  Also refer to After Visit Summary Diet: Regular Outpatient follow up: FU with clinic ASAP Postpartum contraception: None needed yet  Discharged Condition: Stable  Discharged to: home    Sharee PimpleCaron W Jones, [place] 05/10/2016

## 2016-05-10 NOTE — Progress Notes (Signed)
S: I am not having UC's or any passing out.  After full discussion and assessment by Dr Feliberto GottronSchermerhorn, pt will be dischaged and a 24 urine for protein/creatinine will be started in am. Pt will bring to hospital and see [facility] on Monday for follow-up. Pt describes a pre-syncopal episode and states it happened on Monday and again yest and lasted a few seconds. Dr Felicita GageJS did not feel pt had a 4/6 heart murmur as I had heard earlier. Pt is stable with BP 120/79and pulse is 80-90. Will dc home to rest, out of work till further notice.  NST reactive. Cx: 2/80%/vtx-2.  A: IUP at37 4/7 weeks with pre-syncopal episode 2. Prot/creat ratio is 410 P: 1. 24 urine for protein/creatinine sent with pt 2. [facility]

## 2016-05-10 NOTE — Progress Notes (Signed)
Lab results reported to Joy Berry,cnm. Will come to discuss plan of care

## 2016-05-10 NOTE — Progress Notes (Signed)
Pt seen by c jones, cnm for assessment. Orders noted. EKG called for EKG test. Awaiting lab results and potassium packet from pharmacy.

## 2016-05-10 NOTE — Discharge Instructions (Signed)
24-Hour Urine Collection  HOW DO I DO A 24-HOUR URINE COLLECTION?  · When you get up in the morning, urinate in the toilet and flush. Write down the time. This will be your start time on the day of collection and your end time on the next morning.  · From then on, collect all of your urine in the plastic jug that is given to you.  · Stop collecting your urine 24 hours after you started.  · If the plastic jug that is given to you already has liquid in it, that is okay. Do not throw out the liquid or rinse out the jug. Some tests need the liquid to be added to your urine.  · Keep your plastic jug cool in an ice chest or keep it in the refrigerator during the test.  · When 24 hours are over, bring your plastic jug to the clinic lab. Keep the jug cool in an ice chest while you are bringing it to the lab.     This information is not intended to replace advice given to you by your health care provider. Make sure you discuss any questions you have with your health care provider.     Document Released: 11/02/2008 Document Revised: 08/27/2014 Document Reviewed: 12/30/2013  Elsevier Interactive Patient Education ©2016 Elsevier Inc[employer]

## 2016-05-10 NOTE — Progress Notes (Signed)
Obstetrics Admission History & Physical  Referring Provider: [facility] Primary OBGYN: [facility]   Chief Complaint:  History of Present Illness  20 y.o. G2P1001 @ 323w4d (Dating:). Pregnancy complicated by:  [name] presents for 1 episode of passing out yest for a few seconds and again another time and wanted to get checked.   Review of Systems:  her 12 point review of systems is negative or as noted in the History of Present Illness.  Patient Active Problem List   Diagnosis Date Noted  . Iron deficiency anemia 03/13/2016     PMHx:  Past Medical History:  Diagnosis Date  . Anemia    PSHx:  Past Surgical History:  Procedure Laterality Date  . NASAL SINUS SURGERY    . TONSILLECTOMY     Medications:  Prescriptions Prior to Admission  Medication Sig Dispense Refill Last Dose  . cyclobenzaprine (FLEXERIL) 5 MG tablet Take 1 tablet (5 mg total) by mouth 3 (three) times daily as needed for muscle spasms. 15 tablet 0 Past Month at Unknown time  . Prenatal Vit-Fe Fumarate-FA (PRENATAL VITAMINS) 28-0.8 MG TABS Take 1 tablet by mouth 1 day or 1 dose. (Patient taking differently: Take 1 tablet by mouth every evening. ) 30 tablet 0 Past Month at Unknown time   Allergies: has No Known Allergies. OBHx:  OB History  Gravida Para Term Preterm AB Living  2 1 1     1   SAB TAB Ectopic Multiple Live Births          1    # Outcome Date GA Lbr Len/2nd Weight Sex Delivery Anes PTL Lv  2 Current           1 Term 06/23/14 10115w0d   F Vag-Spont   LIV     GYNHx:  History of abnormal pap smears: not found History of STIs:    neg         FHx:  Family History  Problem Relation Age of Onset  . Hypertension Paternal Grandmother    Soc Hx:  Social History   Social History  . Marital status: Single    Spouse name: N/A  . Number of children: N/A  . Years of education: N/A   Occupational History  . Not on file.   Social History Main Topics  . Smoking status: Current Some Day Smoker    Years: 2.00    Types: Cigars, Cigarettes    Last attempt to quit: 11/19/2015  . Smokeless tobacco: Never Used  . Alcohol use No     Comment: before pregnant-occassional drinker liquour  . Drug use: No  . Sexual activity: Yes   Other Topics Concern  . Not on file   Social History Narrative  . No narrative on file    Objective   Vitals:   05/10/16 1008 05/10/16 1051  BP: 135/80 (!) 141/84  Pulse: 92 87  Resp: 16   Temp: 98.1 F (36.7 C)    Temp:  [98.1 F (36.7 C)] 98.1 F (36.7 C) (09/21 1008) Pulse Rate:  [87-92] 87 (09/21 1051) Resp:  [16] 16 (09/21 1008) BP: (135-141)/(80-84) 141/84 (09/21 1051) Temp (24hrs), Avg:98.1 F (36.7 C), Min:98.1 F (36.7 C), Max:98.1 F (36.7 C)  No intake or output data in the 24 hours ending 05/10/16 1349    Current Vital Signs 24h Vital Sign Ranges  T 98.1 F (36.7 C) Temp  Avg: 98.1 F (36.7 C)  Min: 98.1 F (36.7 C)  Max: 98.1 F (  36.7 C)  BP (!) 141/84 BP  Min: 135/80  Max: 141/84  HR 87 Pulse  Avg: 89.5  Min: 87  Max: 92  RR 16 Resp  Avg: 16  Min: 16  Max: 16  SaO2     No Data Recorded       24 Hour I/O Current Shift I/O  Time Ins Outs No intake/output data recorded. No intake/output data recorded.   EFM: 145 Toco: none on monitor  General: Well nourished, well developed female in no acute distress.  Skin:  Warm and dry.  Cardiovascular: S1S2, RRR, No M/R/G. Respiratory:  Clear to auscultation bilateral. Normal respiratory effort. No W/R/R. Abdomen: Gravid Neuro/Psych:  Normal mood and affect.   SVE: not checked   Labs  Prot/creatinine: 410    Perinatal info    Assessment & Plan   20 y.o. G2P1001 @ [redacted]w[redacted]d with signs and symptoms, with  IUP at 37 4/7 weeks EKG NSR  Joy Pimple, MSN, CNM, FNP [facility]

## 2016-05-10 NOTE — OB Triage Note (Signed)
Discharge instruction with labor precautions, pree teaching and detailed information about 24 hour urine collection instructions provided to pt prior to discharge. Pt confirms active fetal movement, no signs of active labor, rates pain on Lt side abd 6/10, pt remains calm, no grimacing or signs of distress noted as pt was ambulatory off unit.

## 2016-05-10 NOTE — Plan of Care (Signed)
Awaiting decision  For plan of care from c jones,cnm. Report given to nikki,rn

## 2016-05-12 DIAGNOSIS — O99013 Anemia complicating pregnancy, third trimester: Secondary | ICD-10-CM | POA: Diagnosis present

## 2016-05-12 DIAGNOSIS — D509 Iron deficiency anemia, unspecified: Secondary | ICD-10-CM | POA: Diagnosis not present

## 2016-05-12 LAB — PROTEIN, URINE, 24 HOUR
Collection Interval-UPROT: 24 hours
PROTEIN, 24H URINE: 735 mg/d — AB (ref 50–100)
Protein, Urine: 30 mg/dL
Urine Total Volume-UPROT: 2450 mL

## 2016-05-16 ENCOUNTER — Other Ambulatory Visit: Payer: Self-pay | Admitting: *Deleted

## 2016-05-16 DIAGNOSIS — D509 Iron deficiency anemia, unspecified: Secondary | ICD-10-CM

## 2016-05-16 NOTE — Progress Notes (Deleted)
[facility]  Telephone:([phone]) [phone] [phone]  ID: Cindra Presume OB: 1996/06/08  MR#: [id number]  ONG#:[id number]  Patient Care Team: [facility] as PCP - General (General Practice)  CHIEF COMPLAINT: Iron deficiency anemia in pregnancy.  INTERVAL HISTORY: Patient is a 20 year old female whose last evaluated in clinic approximately 2 years ago. She was noted to have iron deficiency anemia and her first pregnancy. Routine lab work in her third trimester revealed a significantly decreased hemoglobin as well as decreased ferritin level. Currently she feels well and is asymptomatic. She does not complain of weakness or fatigue. She has no neurologic complaints. She denies any recent fevers. She is gaining weight appropriately. She has no chest pain or shortness of breath. She denies any nausea, vomiting, constipation, or diarrhea. She has no urinary complaints. Patient offers no specific complaints today.  REVIEW OF SYSTEMS:   Review of Systems  Constitutional: Negative.  Negative for fever, malaise/fatigue and weight loss.  Respiratory: Negative.  Negative for cough and shortness of breath.   Cardiovascular: Negative.  Negative for chest pain.  Gastrointestinal: Negative.  Negative for abdominal pain, blood in stool and melena.  Genitourinary: Negative.   Musculoskeletal: Negative.   Neurological: Negative.   Psychiatric/Behavioral: Negative.  The patient is not nervous/anxious.     As per HPI. Otherwise, a complete review of systems is negatve.  PAST MEDICAL HISTORY: Past Medical History:  Diagnosis Date  . Anemia     PAST SURGICAL HISTORY: Past Surgical History:  Procedure Laterality Date  . NASAL SINUS SURGERY    . TONSILLECTOMY      FAMILY HISTORY: Reviewed and unchanged. No reported history of malignancy or chronic disease.     ADVANCED DIRECTIVES (Y/N):  N   HEALTH MAINTENANCE: Social History  Substance Use  Topics  . Smoking status: Current Some Day Smoker    Years: 2.00    Types: Cigars, Cigarettes    Last attempt to quit: 11/19/2015  . Smokeless tobacco: Never Used  . Alcohol use No     Comment: before pregnant-occassional drinker liquour     Colonoscopy:  PAP:  Bone density:  Lipid panel:  No Known Allergies  Current Outpatient Prescriptions  Medication Sig Dispense Refill  . cyclobenzaprine (FLEXERIL) 5 MG tablet Take 1 tablet (5 mg total) by mouth 3 (three) times daily as needed for muscle spasms. 15 tablet 0  . Prenatal Vit-Fe Fumarate-FA (PRENATAL VITAMINS) 28-0.8 MG TABS Take 1 tablet by mouth 1 day or 1 dose. (Patient taking differently: Take 1 tablet by mouth every evening. ) 30 tablet 0   No current facility-administered medications for this visit.     OBJECTIVE: There were no vitals filed for this visit.   There is no height or weight on file to calculate BMI.    ECOG FS:0 - Asymptomatic  General: Well-developed, well-nourished, no acute distress. Eyes: Pink conjunctiva, anicteric sclera. HEENT: Normocephalic, moist mucous membranes, clear oropharnyx. Lungs: Clear to auscultation bilaterally. Heart: Regular rate and rhythm. No rubs, murmurs, or gallops. Abdomen: Appears appropriate for gestational age. Musculoskeletal: No edema, cyanosis, or clubbing. Neuro: Alert, answering all questions appropriately. Cranial nerves grossly intact. Skin: No rashes or petechiae noted. Psych: Normal affect. Lymphatics: No cervical, calvicular, axillary or inguinal LAD.   LAB RESULTS:  Lab Results  Component Value Date   NA 136 05/10/2016   K 3.2 (L) 05/10/2016   CL 107 05/10/2016   CO2 23 05/10/2016   GLUCOSE 97 05/10/2016  BUN 5 (L) 05/10/2016   CREATININE 0.40 (L) 05/10/2016   CALCIUM 9.0 05/10/2016   PROT 6.9 05/10/2016   ALBUMIN 2.8 (L) 05/10/2016   AST 35 05/10/2016   ALT 47 05/10/2016   ALKPHOS 149 (H) 05/10/2016   BILITOT 0.6 05/10/2016   GFRNONAA >60  05/10/2016   GFRAA >60 05/10/2016    Lab Results  Component Value Date   WBC 7.9 05/10/2016   NEUTROABS 14.7 (H) 08/17/2014   HGB 9.3 (L) 05/10/2016   HCT 28.0 (L) 05/10/2016   MCV 77.7 (L) 05/10/2016   PLT 285 05/10/2016     STUDIES: No results found.  ASSESSMENT: Iron deficiency anemia in pregnancy.  PLAN:    1. Iron deficiency anemia in pregnancy: Patient is in her third trimester pregnancy and found to have significantly reduced hemoglobin 7.3 as well as a ferritin level of 8. Patient was also noted to be iron deficient with her first pregnancy several years ago. Proceed with 510 mg IV Feraheme today and then return to clinic in 1 week for a second infusion. Patient is due date is at the beginning of October, therefore she will return to clinic at the end of September for repeat laboratory work, further evaluation, and consideration of additional IV iron. If her daughter is born early, this appointment will be canceled and patient will be evaluated 3 months postpartum. 2. Pregnancy: Patient's due date is in early October. She is anticipating a natural birth with her second daughter.   Patient expressed understanding and was in agreement with this plan. She also understands that She can call clinic at any time with any questions, concerns, or complaints.    Jeralyn Ruthsimothy J Finnegan, MD   05/16/2016 11:14 PM

## 2016-05-17 ENCOUNTER — Inpatient Hospital Stay: Payer: Medicaid Other

## 2016-05-17 ENCOUNTER — Inpatient Hospital Stay: Payer: Medicaid Other | Admitting: Oncology

## 2016-06-04 ENCOUNTER — Telehealth: Payer: Self-pay | Admitting: Obstetrics and Gynecology

## 2016-06-04 NOTE — Telephone Encounter (Signed)
Pt did not show for IOL as scheduled. Pt called and LM to call L&D.

## 2017-03-15 ENCOUNTER — Encounter (HOSPITAL_COMMUNITY): Payer: Self-pay

## 2019-07-04 ENCOUNTER — Other Ambulatory Visit: Payer: Self-pay

## 2019-07-04 ENCOUNTER — Encounter (HOSPITAL_COMMUNITY): Payer: Self-pay | Admitting: Emergency Medicine

## 2019-07-04 ENCOUNTER — Emergency Department (HOSPITAL_COMMUNITY)
Admission: EM | Admit: 2019-07-04 | Discharge: 2019-07-04 | Disposition: A | Discharge status: Home / Self Care | Payer: Medicaid Other | Attending: Emergency Medicine | Admitting: Emergency Medicine

## 2019-07-04 DIAGNOSIS — Z202 Contact with and (suspected) exposure to infections with a predominantly sexual mode of transmission: Secondary | ICD-10-CM

## 2019-07-04 DIAGNOSIS — N760 Acute vaginitis: Secondary | ICD-10-CM | POA: Diagnosis not present

## 2019-07-04 DIAGNOSIS — B9689 Other specified bacterial agents as the cause of diseases classified elsewhere: Secondary | ICD-10-CM | POA: Insufficient documentation

## 2019-07-04 DIAGNOSIS — Z720 Tobacco use: Secondary | ICD-10-CM | POA: Insufficient documentation

## 2019-07-04 DIAGNOSIS — A5901 Trichomonal vulvovaginitis: Secondary | ICD-10-CM | POA: Insufficient documentation

## 2019-07-04 LAB — WET PREP, GENITAL
Sperm: NONE SEEN
Yeast Wet Prep HPF POC: NONE SEEN

## 2019-07-04 LAB — POC URINE PREG, ED: Preg Test, Ur: NEGATIVE

## 2019-07-04 LAB — HIV ANTIBODY (ROUTINE TESTING W REFLEX): HIV Screen 4th Generation wRfx: NONREACTIVE

## 2019-07-04 MED ORDER — METRONIDAZOLE 500 MG PO TABS
2000.0000 mg | ORAL_TABLET | Freq: Once | ORAL | Status: AC
  Administered 2019-07-04: 2000 mg via ORAL
  Filled 2019-07-04: qty 4

## 2019-07-04 MED ORDER — CEFTRIAXONE SODIUM 250 MG IJ SOLR
250.0000 mg | Freq: Once | INTRAMUSCULAR | Status: AC
  Administered 2019-07-04: 250 mg via INTRAMUSCULAR
  Filled 2019-07-04: qty 250

## 2019-07-04 MED ORDER — AZITHROMYCIN 250 MG PO TABS
1000.0000 mg | ORAL_TABLET | Freq: Once | ORAL | Status: AC
  Administered 2019-07-04: 1000 mg via ORAL
  Filled 2019-07-04: qty 4

## 2019-07-04 MED ORDER — STERILE WATER FOR INJECTION IJ SOLN
INTRAMUSCULAR | Status: AC
  Filled 2019-07-04: qty 10

## 2019-07-04 MED ORDER — METRONIDAZOLE 500 MG PO TABS: 500.0000 mg | ORAL_TABLET | Freq: Two times a day (BID) | ORAL | 0 refills | Status: AC

## 2019-07-04 NOTE — Discharge Instructions (Signed)
Please do not have any sexual contact for 2 weeks.  It is important that any partners get treated and wait 2 weeks before having sexual contact.  You have test for gonorrhea and chlamydia, HIV, and syphilis pending.  You will only receive a call if these are positive.  No news is good news.  It is important that you always use barrier protection such as a condom when having sexual intercourse.    Today your diagnosed with bacterial vaginosis and received a prescription for metronidazole also known as Flagyl. It is very important that you do not consume any alcohol while taking this medication as it will cause you to become violently ill.  You may have diarrhea from the antibiotics.  It is very important that you continue to take the antibiotics even if you get diarrhea unless a medical professional tells you that you may stop taking them.  If you stop too early the bacteria you are being treated for will become stronger and you may need different, more powerful antibiotics that have more side effects and worsening diarrhea.  Please stay well hydrated and consider probiotics as they may decrease the severity of your diarrhea.  Please be aware that if you take any hormonal contraception (birth control pills, nexplanon, the ring, etc) that your birth control will not work while you are taking antibiotics and you need to use back up protection as directed on the birth control medication information insert.

## 2019-07-04 NOTE — ED Triage Notes (Signed)
Pt states boyfriend was seen in ED this morning and treated for STD.  She denies any symptoms.

## 2019-07-04 NOTE — ED Provider Notes (Signed)
[facility] EMERGENCY DEPARTMENT Provider Note   CSN: [id number] Arrival date & time: 07/04/19  1631     History   Chief Complaint Chief Complaint  Patient presents with  . Exposure to STD    HPI Joy Berry is a 23 y.o. female with no significant past medical history who presents today for evaluation of concern of exposure to sexually transmitted infection.  She reports that her boyfriend was seen in the emergency room this morning for penile discharge.  She states that her boyfriend, who is present in the room, has had multiple partners.  She denies any fevers dysuria increased frequency urgency.  No pelvic pain, abnormal vaginal bleeding abnormal vaginal discharge or other related symptoms.      HPI  Past Medical History:  Diagnosis Date  . Anemia     Patient Active Problem List   Diagnosis Date Noted  . Iron deficiency anemia 03/13/2016    Past Surgical History:  Procedure Laterality Date  . NASAL SINUS SURGERY    . TONSILLECTOMY       OB History    Gravida  2   Para  1   Term  1   Preterm      AB      Living  1     SAB      TAB      Ectopic      Multiple      Live Births  1            Home Medications    Prior to Admission medications   Medication Sig Start Date End Date Taking? Authorizing Provider  cyclobenzaprine (FLEXERIL) 5 MG tablet Take 1 tablet (5 mg total) by mouth 3 (three) times daily as needed for muscle spasms. 04/20/16   Nance Pear, MD  metroNIDAZOLE (FLAGYL) 500 MG tablet Take 1 tablet (500 mg total) by mouth 2 (two) times daily. 07/04/19   Lorin Glass, PA-C  Prenatal Vit-Fe Fumarate-FA (PRENATAL VITAMINS) 28-0.8 MG TABS Take 1 tablet by mouth 1 day or 1 dose. Patient taking differently: Take 1 tablet by mouth every evening.  11/05/15   Sable Feil, PA-C    Family History Family History  Problem Relation Age of Onset  . Hypertension Paternal Grandmother     Social History  Social History   Tobacco Use  . Smoking status: Current Some Day Smoker    Years: 2.00    Types: Cigars, Cigarettes    Last attempt to quit: 11/19/2015    Years since quitting: 3.6  . Smokeless tobacco: Never Used  Substance Use Topics  . Alcohol use: No    Comment: before pregnant-occassional drinker liquour  . Drug use: No     Allergies   Patient has no known allergies.   Review of Systems Review of Systems  Constitutional: Negative for chills and fever.  Genitourinary: Negative for dysuria, hematuria, menstrual problem, pelvic pain, urgency, vaginal bleeding, vaginal discharge and vaginal pain.  All other systems reviewed and are negative.    Physical Exam Updated Vital Signs BP 133/84 (BP Location: Right Arm)   Pulse (!) 57   Temp 98.3 F (36.8 C) (Oral)   Resp 18   LMP 06/17/2019   SpO2 100%   Physical Exam Vitals signs and nursing note reviewed.  Constitutional:      General: She is not in acute distress.    Appearance: She is not ill-appearing.  HENT:     Head:  Normocephalic.  Cardiovascular:     Rate and Rhythm: Normal rate.  Pulmonary:     Effort: Pulmonary effort is normal. No respiratory distress.  Abdominal:     Tenderness: There is no abdominal tenderness.  Genitourinary:    Comments: Patient refused Neurological:     Mental Status: She is alert.      ED Treatments / Results  Labs (all labs ordered are listed, but only abnormal results are displayed) Labs Reviewed  WET PREP, GENITAL - Abnormal; Notable for the following components:      Result Value   Trich, Wet Prep PRESENT (*)    Clue Cells Wet Prep HPF POC PRESENT (*)    WBC, Wet Prep HPF POC FEW (*)    All other components within normal limits  HIV ANTIBODY (ROUTINE TESTING W REFLEX)  RPR  POC URINE PREG, ED  GC/CHLAMYDIA PROBE AMP ([place]) NOT AT [facility]    EKG None  Radiology No results found.  Procedures Procedures (including critical care time)  Medications  Ordered in ED Medications  cefTRIAXone (ROCEPHIN) injection 250 mg (250 mg Intramuscular Given 07/04/19 2107)  azithromycin (ZITHROMAX) tablet 1,000 mg (1,000 mg Oral Given 07/04/19 2107)  metroNIDAZOLE (FLAGYL) tablet 2,000 mg (2,000 mg Oral Given 07/04/19 2107)     Initial Impression / Assessment and Plan / ED Course  I have reviewed the triage vital signs and the nursing notes.  Pertinent labs & imaging results that were available during my care of the patient were reviewed by me and considered in my medical decision making (see chart for details).       Patient presents today for concern of sexually transmitted infection as her boyfriend has had penile discharge.  She does not have any symptoms.  We discussed pelvic exam versus self swab.  Patient and I discussed possible missed infections along with other risks of refusing pelvic exam and doing self swab and she made the informed decision to self swab.  She is also requesting empiric treatment, HIV, and syphilis testing.  Wet prep is positive for trichomoniasis and clue cells.  Pregnancy test is negative.  She is treated with Flagyl for trichomoniasis along with Rocephin and azithromycin for empiric treatment against gonorrhea and chlamydia.  In addition given clue cells she is given a prescription for Flagyl to continue at home.  She is instructed not to drink alcohol while taking this medication.  She is aware that any partners need to be notified and treated for trichomoniasis.  She is advised to abstain from sexual activity for 2 weeks.  She is given follow-up with the health department as needed.  Return precautions were discussed with patient who states their understanding.  At the time of discharge patient denied any unaddressed complaints or concerns.  Patient is agreeable for discharge home.   Final Clinical Impressions(s) / ED Diagnoses   Final diagnoses:  Trichomonal vaginitis  Possible exposure to STD  BV (bacterial  vaginosis)    ED Discharge Orders         Ordered    metroNIDAZOLE (FLAGYL) 500 MG tablet  2 times daily     07/04/19 2027           Cristina Gong, [place] 07/05/19 8546    Rolan Bucco, MD 07/05/19 1108

## 2019-07-05 LAB — RPR: RPR Ser Ql: NONREACTIVE

## 2019-07-07 LAB — GC/CHLAMYDIA PROBE AMP (~~LOC~~) NOT AT ARMC
Chlamydia: NEGATIVE
Neisseria Gonorrhea: NEGATIVE

## 2020-03-14 ENCOUNTER — Ambulatory Visit
Admission: EM | Admit: 2020-03-14 | Discharge: 2020-03-14 | Disposition: A | Discharge status: Home / Self Care | Payer: Medicaid Other | Attending: Family Medicine | Admitting: Family Medicine

## 2020-03-14 ENCOUNTER — Other Ambulatory Visit: Payer: Self-pay

## 2020-03-14 DIAGNOSIS — N390 Urinary tract infection, site not specified: Secondary | ICD-10-CM | POA: Insufficient documentation

## 2020-03-14 LAB — URINALYSIS, COMPLETE (UACMP) WITH MICROSCOPIC
Bilirubin Urine: NEGATIVE
Glucose, UA: NEGATIVE mg/dL
Hgb urine dipstick: NEGATIVE
Ketones, ur: NEGATIVE mg/dL
Leukocytes,Ua: NEGATIVE
Nitrite: POSITIVE — AB
Protein, ur: NEGATIVE mg/dL
Specific Gravity, Urine: >1.03 — ABNORMAL HIGH (ref 1.005–1.030)
pH: 6.5 (ref 5.0–8.0)

## 2020-03-14 LAB — PREGNANCY, URINE: Preg Test, Ur: NEGATIVE

## 2020-03-14 MED ORDER — CEPHALEXIN 500 MG PO CAPS: 500.0000 mg | ORAL_CAPSULE | Freq: Two times a day (BID) | ORAL | 0 refills | Status: AC

## 2020-03-14 NOTE — Discharge Instructions (Addendum)
Take medication as prescribed. Rest. Drink plenty of fluids.  ° °Follow up with your primary care physician this week as needed. Return to Urgent care for new or worsening concerns.  ° °

## 2020-03-14 NOTE — ED Provider Notes (Addendum)
[facility] ____________________________________________  Time seen: Approximately 5:04 PM  I have reviewed the triage vital signs and the nursing notes.   HISTORY  Chief Complaint Abdominal Pain   HPI Joy Berry is a 24 y.o. female presenting for evaluation of mid suprapubic abdominal pain present for the last 2 days.  States feels like she is having some pressure to her abdomen.  Has noticed that her urine is darker in coloration.  Denies burning with urination or frequency.  Denies other abdominal pain, back pain, flank pain, fevers, vomiting or diarrhea.  Denies vaginal discharge or vaginal complaints.  Continues eat and drink well.  Denies aggravating alleviating factors.  States some similar presentation with previous UTIs.     Past Medical History:  Diagnosis Date  . Anemia     Patient Active Problem List   Diagnosis Date Noted  . Iron deficiency anemia 03/13/2016    Past Surgical History:  Procedure Laterality Date  . NASAL SINUS SURGERY    . TONSILLECTOMY       No current facility-administered medications for this encounter.  Current Outpatient Medications:  .  cephALEXin (KEFLEX) 500 MG capsule, Take 1 capsule (500 mg total) by mouth 2 (two) times daily for 7 days., Disp: 14 capsule, Rfl: 0 .  cyclobenzaprine (FLEXERIL) 5 MG tablet, Take 1 tablet (5 mg total) by mouth 3 (three) times daily as needed for muscle spasms., Disp: 15 tablet, Rfl: 0 .  metroNIDAZOLE (FLAGYL) 500 MG tablet, Take 1 tablet (500 mg total) by mouth 2 (two) times daily., Disp: 14 tablet, Rfl: 0 .  Prenatal Vit-Fe Fumarate-FA (PRENATAL VITAMINS) 28-0.8 MG TABS, Take 1 tablet by mouth 1 day or 1 dose. (Patient taking differently: Take 1 tablet by mouth every evening. ), Disp: 30 tablet, Rfl: 0  Allergies Patient has no known allergies.  Family History  Problem Relation Age of Onset  . Hypertension Paternal Grandmother   . Healthy Mother   . Diabetes Father      Social History Social History   Tobacco Use  . Smoking status: Current Some Day Smoker    Years: 2.00    Types: Cigars, Cigarettes    Last attempt to quit: 11/19/2015    Years since quitting: 4.3  . Smokeless tobacco: Never Used  Vaping Use  . Vaping Use: Never used  Substance Use Topics  . Alcohol use: Yes    Comment: occasionally  . Drug use: No    Review of Systems Constitutional: No fever Cardiovascular: Denies chest pain. Respiratory: Denies shortness of breath. Gastrointestinal: positive abdominal pain.  No nausea, no vomiting.  No diarrhea. Genitourinary: positive for dysuria. Musculoskeletal: Negative for back pain. Skin: Negative for rash.  ____________________________________________   PHYSICAL EXAM:  VITAL SIGNS: ED Triage Vitals  Enc Vitals Group     BP 03/14/20 1544 (!) 140/83     Pulse Rate 03/14/20 1544 68     Resp 03/14/20 1544 18     Temp 03/14/20 1544 98.2 F (36.8 C)     Temp Source 03/14/20 1544 Oral     SpO2 03/14/20 1544 100 %     Weight 03/14/20 1540 180 lb (81.6 kg)     Height 03/14/20 1540 5\' 9"  (1.753 m)     Head Circumference --      Peak Flow --      Pain Score 03/14/20 1540 8     Pain Loc --      Pain Edu? --  Excl. in GC? --     Constitutional: Alert and oriented. Well appearing and in no acute distress. Eyes: Conjunctivae are normal.  ENT      Head: Normocephalic and atraumatic. Cardiovascular: Normal rate, regular rhythm. Grossly normal heart sounds.  Good peripheral circulation. Respiratory: Normal respiratory effort without tachypnea nor retractions. Breath sounds are clear and equal bilaterally. No wheezes, rales, rhonchi. Gastrointestinal:  Normal Bowel sounds.  Midline suprapubic tenderness. Abdomen otherwise soft nontender.  No CVA tenderness. Musculoskeletal: No midline cervical, thoracic or lumbar tenderness to palpation. Neurologic:  Normal speech and language. Speech is normal. No gait instability.  Skin:   Skin is warm, dry and intact. No rash noted. Psychiatric: Mood and affect are normal. Speech and behavior are normal. Patient exhibits appropriate insight and judgment   ___________________________________________   LABS (all labs ordered are listed, but only abnormal results are displayed)  Labs Reviewed  URINALYSIS, COMPLETE (UACMP) WITH MICROSCOPIC - Abnormal; Notable for the following components:      Result Value   Specific Gravity, Urine >1.030 (*)    Nitrite POSITIVE (*)    Bacteria, UA MANY (*)    All other components within normal limits  URINE CULTURE  PREGNANCY, URINE    PROCEDURES Procedures    INITIAL IMPRESSION / ASSESSMENT AND PLAN / ED COURSE  Pertinent labs & imaging results that were available during my care of the patient were reviewed by me and considered in my medical decision making (see chart for details).  Well-appearing patient.  No acute distress.  Urinalysis reviewed, UTI.  We will culture.  Will empirically start on oral Keflex.  Urine pregnancy negative.  Rest, fluids, supportive care.Discussed indication, risks and benefits of medications with patient.   Discussed follow up with Primary care physician this week. Discussed follow up and return parameters including no resolution or any worsening concerns. Patient verbalized understanding and agreed to plan.   ____________________________________________   FINAL CLINICAL IMPRESSION(S) / ED DIAGNOSES  Final diagnoses:  Urinary tract infection without hematuria, site unspecified     ED Discharge Orders         Ordered    cephALEXin (KEFLEX) 500 MG capsule  2 times daily     Discontinue  Reprint     03/14/20 1648           Note: This dictation was prepared with Dragon dictation along with smaller phrase technology. Any transcriptional errors that result from this process are unintentional.         Renford Dills, NP 03/14/20 1706    Renford Dills, NP 03/14/20 [phone]

## 2020-03-14 NOTE — ED Triage Notes (Signed)
Patient complains of lower abdominal pain that started 2 days ago. States that pain is worse with sitting. Reports that she has had some diarrhea.

## 2020-03-17 LAB — URINE CULTURE: Culture: >=100000 — AB

## 2021-07-06 ENCOUNTER — Encounter: Payer: Self-pay | Admitting: Oncology

## 2021-07-06 ENCOUNTER — Other Ambulatory Visit: Payer: Self-pay

## 2021-07-06 ENCOUNTER — Encounter: Payer: Self-pay | Admitting: Family Medicine

## 2021-07-06 ENCOUNTER — Ambulatory Visit: Payer: Medicaid Other | Admitting: Family Medicine

## 2021-07-06 DIAGNOSIS — B9689 Other specified bacterial agents as the cause of diseases classified elsewhere: Secondary | ICD-10-CM

## 2021-07-06 DIAGNOSIS — Z113 Encounter for screening for infections with a predominantly sexual mode of transmission: Secondary | ICD-10-CM | POA: Diagnosis not present

## 2021-07-06 DIAGNOSIS — A599 Trichomoniasis, unspecified: Secondary | ICD-10-CM

## 2021-07-06 LAB — HEPATITIS B SURFACE ANTIGEN: Hepatitis B Surface Ag: NONREACTIVE

## 2021-07-06 LAB — WET PREP FOR TRICH, YEAST, CLUE
Trichomonas Exam: POSITIVE — AB
Yeast Exam: NEGATIVE

## 2021-07-06 LAB — HM HEPATITIS C SCREENING LAB: HM Hepatitis Screen: NEGATIVE

## 2021-07-06 LAB — HM HIV SCREENING LAB: HM HIV Screening: NEGATIVE

## 2021-07-06 MED ORDER — METRONIDAZOLE 500 MG PO TABS: 500.0000 mg | ORAL_TABLET | Freq: Two times a day (BID) | ORAL | 0 refills | Status: AC

## 2021-07-06 NOTE — Progress Notes (Signed)
[facility] Department STI clinic/screening visit  Subjective:  Joy Berry is a 25 y.o. female being seen today for an STI screening visit. The patient reports they do have symptoms.  Patient reports that they do not desire a pregnancy in the next year.   They reported they are not interested in discussing contraception today.  Patient's last menstrual period was 07/03/2021 (exact date).   Patient has the following medical conditions:   Patient Active Problem List   Diagnosis Date Noted   Iron deficiency anemia 03/13/2016    Chief Complaint  Patient presents with   SEXUALLY TRANSMITTED DISEASE    screening    HPI  Patient reports here for screening, they do have symptoms.    Last HIV test per patient/review of record was 06/2019 Patient reports does not remember when last pap was.   See flowsheet for further details and programmatic requirements.    The following portions of the patient's history were reviewed and updated as appropriate: allergies, current medications, past medical history, past social history, past surgical history and problem list.  Objective:  There were no vitals filed for this visit.  Physical Exam Vitals and nursing note reviewed.  Constitutional:      Appearance: Normal appearance.  HENT:     Head: Normocephalic and atraumatic.     Mouth/Throat:     Mouth: Mucous membranes are moist.     Pharynx: Oropharynx is clear. No oropharyngeal exudate or posterior oropharyngeal erythema.  Pulmonary:     Effort: Pulmonary effort is normal.  Abdominal:     General: Abdomen is flat.     Palpations: There is no mass.     Tenderness: There is no abdominal tenderness. There is no rebound.  Genitourinary:    General: Normal vulva.     Exam position: Lithotomy position.     Pubic Area: No rash or pubic lice.      Labia:        Right: No rash or lesion.        Left: No rash or lesion.      Vagina: Normal. No vaginal discharge, erythema,  bleeding or lesions.     Cervix: No cervical motion tenderness, discharge, friability, lesion or erythema.     Uterus: Normal.      Adnexa: Right adnexa normal and left adnexa normal.     Rectum: Normal.     Comments: External genitalia without, lice, nits, erythema, edema , lesions or inguinal adenopathy. Vagina with normal mucosa and tan/ gray, odorous discharge and pH > 4.  Cervix without visual lesions, uterus firm, mobile, non-tender, no masses, CMT adnexal fullness or tenderness.   Lymphadenopathy:     Head:     Right side of head: No preauricular or posterior auricular adenopathy.     Left side of head: No preauricular or posterior auricular adenopathy.     Cervical: No cervical adenopathy.     Upper Body:     Right upper body: No supraclavicular or axillary adenopathy.     Left upper body: No supraclavicular or axillary adenopathy.     Lower Body: No right inguinal adenopathy. No left inguinal adenopathy.  Skin:    General: Skin is warm and dry.     Findings: No rash.  Neurological:     Mental Status: She is alert and oriented to person, place, and time.  Psychiatric:        Mood and Affect: Mood normal.  Behavior: Behavior normal.     Assessment and Plan:  Joy Berry is a 25 y.o. female presenting to the [facility] Department for STI screening  1. Screening examination for venereal disease Patient accepted all screenings including wet prep, oral, vaginal CT/GC and bloodwork for HIV/RPR.  Patient meets criteria for HepB screening? Yes. Ordered? Yes Patient meets criteria for HepC screening? Yes. Ordered? Yes  Wet prep results +, trich, + amine, + clue   Treatment needed  Discussed time line for State [facility] results and that patient will be called with positive results and encouraged patient to call if she had not heard in 2 weeks.  Counseled to return or seek care for continued or worsening symptoms Recommended condom use with all sex  Patient is  currently using  no BCM  to prevent pregnancy.   - HBV Antigen/Antibody [facility] - Chlamydia/Gonorrhea [facility] - HIV/HCV [facility] - Syphilis Serology, [facility] - WET PREP FOR TRICH, YEAST, CLUE - Chlamydia/Gonorrhea  [facility]  2. Trichimoniasis  - metroNIDAZOLE (FLAGYL) 500 MG tablet; Take 1 tablet (500 mg total) by mouth 2 (two) times daily for 7 days.  Dispense: 14 tablet; Refill: 0  3. Bacterial vaginosis  - metroNIDAZOLE (FLAGYL) 500 MG tablet; Take 1 tablet (500 mg total) by mouth 2 (two) times daily for 7 days.  Dispense: 14 tablet; Refill: 0     No follow-ups on file.  No future appointments.  Wendi Snipes, FNP
